# Patient Record
Sex: Male | Born: 1945 | Race: White | Hispanic: No | Marital: Married | Smoking: Never smoker
Health system: Southern US, Community
[De-identification: ages and names within clinical notes are randomized; demographics above are authoritative.]

## PROBLEM LIST (undated history)

## (undated) DIAGNOSIS — R42 Dizziness and giddiness: Secondary | ICD-10-CM

## (undated) DIAGNOSIS — M1990 Unspecified osteoarthritis, unspecified site: Secondary | ICD-10-CM

## (undated) DIAGNOSIS — I10 Essential (primary) hypertension: Secondary | ICD-10-CM

## (undated) HISTORY — PX: JOINT REPLACEMENT: SHX530

## (undated) HISTORY — PX: BACK SURGERY: SHX140

## (undated) HISTORY — PX: DIAGNOSTIC LAPAROSCOPY: SUR761

## (undated) HISTORY — PX: CHOLECYSTECTOMY: SHX55

---

## 2016-12-30 HISTORY — PX: TOTAL SHOULDER REPLACEMENT: SUR1217

## 2021-03-26 ENCOUNTER — Other Ambulatory Visit: Payer: Self-pay | Admitting: Neurological Surgery

## 2021-03-26 DIAGNOSIS — M5127 Other intervertebral disc displacement, lumbosacral region: Secondary | ICD-10-CM

## 2021-04-02 ENCOUNTER — Ambulatory Visit (INDEPENDENT_AMBULATORY_CARE_PROVIDER_SITE_OTHER): Payer: Medicare Other

## 2021-04-02 ENCOUNTER — Other Ambulatory Visit: Payer: Self-pay

## 2021-04-02 DIAGNOSIS — M5126 Other intervertebral disc displacement, lumbar region: Secondary | ICD-10-CM | POA: Diagnosis not present

## 2021-04-02 DIAGNOSIS — M5127 Other intervertebral disc displacement, lumbosacral region: Secondary | ICD-10-CM

## 2021-04-02 MED ORDER — GADOBUTROL 1 MMOL/ML IV SOLN
9.5000 mL | Freq: Once | INTRAVENOUS | Status: AC | PRN
  Administered 2021-04-02: 9.5 mL via INTRAVENOUS

## 2021-04-17 ENCOUNTER — Other Ambulatory Visit: Payer: Self-pay | Admitting: Neurological Surgery

## 2021-04-26 NOTE — Progress Notes (Signed)
Surgical Instructions    Your procedure is scheduled on Tuesday May 3,2022  Report to [facility] Main Entrance "A" at 1030 A.M., then check in with the Admitting office.  Call this number if you have problems the morning of surgery:  [phone]   If you have any questions prior to your surgery date call [phone]: Open Monday-Friday 8am-4pm    Remember:  Do not eat or drink after midnight the night before your surgery     Take these medicines the morning of surgery with A SIP OF WATER:              gabapentin (NEURONTIN)               If needed you may take:             acetaminophen (TYLENOL)             traMADol (ULTRAM)  As of today, STOP taking any Aspirin (unless otherwise instructed by your surgeon) Aleve, Naproxen, Ibuprofen, Motrin, Advil, Goody's, BC's, all herbal medications, fish oil, and all vitamins.                     Do not wear jewelry, make up, or nail polish            Do not wear lotions, powders, perfumes/colognes, or deodorant.            Do not shave 48 hours prior to surgery.  Men may shave face and neck.            Do not bring valuables to the hospital.            [facility] is not responsible for any belongings or valuables.  Do NOT Smoke (Tobacco/Vaping) or drink Alcohol 24 hours prior to your procedure If you use a CPAP at night, you may bring all equipment for your overnight stay.   Contacts, glasses, dentures or bridgework may not be worn into surgery, please bring cases for these belongings   For patients admitted to the hospital, discharge time will be determined by your treatment team.   Patients discharged the day of surgery will not be allowed to drive home, and someone needs to stay with them for 24 hours.    Special instructions:   [place]- Preparing For Surgery  Before surgery, you can play an important role. Because skin is not sterile, your skin needs to be as free of germs as possible. You can reduce the number of germs  on your skin by washing with CHG (chlorahexidine gluconate) Soap before surgery.  CHG is an antiseptic cleaner which kills germs and bonds with the skin to continue killing germs even after washing.    Oral Hygiene is also important to reduce your risk of infection.  Remember - BRUSH YOUR TEETH THE MORNING OF SURGERY WITH YOUR REGULAR TOOTHPASTE  Please do not use if you have an allergy to CHG or antibacterial soaps. If your skin becomes reddened/irritated stop using the CHG.  Do not shave (including legs and underarms) for at least 48 hours prior to first CHG shower. It is OK to shave your face.  Please follow these instructions carefully.   1. Shower the NIGHT BEFORE SURGERY and the MORNING OF SURGERY  2. If you chose to wash your hair, wash your hair first as usual with your normal shampoo.  3. After you shampoo, rinse your hair and body thoroughly to remove the shampoo.  4.  Wash Face and genitals (private parts) with your normal soap.   5.  Shower the NIGHT BEFORE SURGERY and the MORNING OF SURGERY with CHG Soap.   6. Use CHG Soap as you would any other liquid soap. You can apply CHG directly to the skin and wash gently with a scrungie or a clean washcloth.   7. Apply the CHG Soap to your body ONLY FROM THE NECK DOWN.  Do not use on open wounds or open sores. Avoid contact with your eyes, ears, mouth and genitals (private parts). Wash Face and genitals (private parts)  with your normal soap.   8. Wash thoroughly, paying special attention to the area where your surgery will be performed.  9. Thoroughly rinse your body with warm water from the neck down.  10. DO NOT shower/wash with your normal soap after using and rinsing off the CHG Soap.  11. Pat yourself dry with a CLEAN TOWEL.  12. Wear CLEAN PAJAMAS to bed the night before surgery  13. Place CLEAN SHEETS on your bed the night before your surgery  14. DO NOT SLEEP WITH PETS.   Day of Surgery: Take a shower with CHG  soap. Wear Clean/Comfortable clothing the morning of surgery Do not apply any deodorants/lotions.   Remember to brush your teeth WITH YOUR REGULAR TOOTHPASTE.   Please read over the following fact sheets that you were given.

## 2021-04-27 ENCOUNTER — Other Ambulatory Visit: Payer: Self-pay

## 2021-04-27 ENCOUNTER — Encounter (HOSPITAL_COMMUNITY)
Admission: RE | Admit: 2021-04-27 | Discharge: 2021-04-27 | Disposition: A | Discharge status: Home / Self Care | Payer: Medicare Other | Source: Ambulatory Visit | Attending: Neurological Surgery | Admitting: Neurological Surgery

## 2021-04-27 ENCOUNTER — Encounter (HOSPITAL_COMMUNITY): Payer: Self-pay

## 2021-04-27 DIAGNOSIS — Z01818 Encounter for other preprocedural examination: Secondary | ICD-10-CM | POA: Insufficient documentation

## 2021-04-27 DIAGNOSIS — I10 Essential (primary) hypertension: Secondary | ICD-10-CM | POA: Insufficient documentation

## 2021-04-27 DIAGNOSIS — Z20822 Contact with and (suspected) exposure to covid-19: Secondary | ICD-10-CM | POA: Diagnosis not present

## 2021-04-27 DIAGNOSIS — Z79899 Other long term (current) drug therapy: Secondary | ICD-10-CM | POA: Diagnosis not present

## 2021-04-27 HISTORY — DX: Unspecified osteoarthritis, unspecified site: M19.90

## 2021-04-27 HISTORY — DX: Essential (primary) hypertension: I10

## 2021-04-27 LAB — SURGICAL PCR SCREEN
MRSA, PCR: NEGATIVE
Staphylococcus aureus: NEGATIVE

## 2021-04-27 LAB — CBC
HCT: 45.3 % (ref 39.0–52.0)
Hemoglobin: 15.6 g/dL (ref 13.0–17.0)
MCH: 31.1 pg (ref 26.0–34.0)
MCHC: 34.4 g/dL (ref 30.0–36.0)
MCV: 90.4 fL (ref 80.0–100.0)
Platelets: 185 10*3/uL (ref 150–400)
RBC: 5.01 MIL/uL (ref 4.22–5.81)
RDW: 12.7 % (ref 11.5–15.5)
WBC: 7.2 10*3/uL (ref 4.0–10.5)
nRBC: 0 % (ref 0.0–0.2)

## 2021-04-27 LAB — BASIC METABOLIC PANEL
Anion gap: 6 (ref 5–15)
BUN: 10 mg/dL (ref 8–23)
CO2: 27 mmol/L (ref 22–32)
Calcium: 9.3 mg/dL (ref 8.9–10.3)
Chloride: 96 mmol/L — ABNORMAL LOW (ref 98–111)
Creatinine, Ser: 0.9 mg/dL (ref 0.61–1.24)
GFR, Estimated: >60 mL/min (ref >60)
Glucose, Bld: 91 mg/dL (ref 70–99)
Potassium: 4.4 mmol/L (ref 3.5–5.1)
Sodium: 129 mmol/L — ABNORMAL LOW (ref 135–145)

## 2021-04-27 LAB — TYPE AND SCREEN
ABO/RH(D): A POS
Antibody Screen: NEGATIVE

## 2021-04-27 NOTE — Progress Notes (Signed)
PCP - Champ Mungo PA, [facility] Cardiologist - Dr. Paulette Blanch Nourrendine,[facility]([place])  PPM/ICD - denies  Chest x-ray - none EKG - 04/27/21 Stress Test - none ECHO - 09/21/20 Cardiac Cath - none  Sleep Study - denies CPAP -na   Fasting Blood Sugar - na Checks Blood Sugar _na  times a day  Blood Thinner Instructions:na Aspirin Instructions:per pre op instruction    COVID TEST-04/27/21    Anesthesia review: no  Patient denies shortness of breath, fever, cough and chest pain at PAT appointment   All instructions explained to the patient, with a verbal understanding of the material. Patient agrees to go over the instructions while at home for a better understanding. Patient also instructed to self quarantine after being tested for COVID-19. The opportunity to ask questions was provided.

## 2021-04-27 NOTE — Progress Notes (Addendum)
Pt's BP was 156/100 on presentation to PAT visit. Pt denied chest pain,SOB. States he took his Lisinopril this morning. Lisinopril dose has been increased from 5mg  to 10 mg per pt and per his PCP's note of 04/24/21. BP rechecked at end of PAT visit was 168/93. Notified anesthesia APP,Angela Kabbe, of pt's elevated BP. Encouraged pt to continue to monitor his BP at home and notify his PCP if it remains elevated. Explained the importance of blood pressure control for preventing heart attack and stroke. Pt stated that he was aware of the risks and he will continue to monitor.

## 2021-04-28 LAB — SARS CORONAVIRUS 2 (TAT 6-24 HRS): SARS Coronavirus 2: NEGATIVE

## 2021-04-30 ENCOUNTER — Encounter (HOSPITAL_COMMUNITY): Payer: Self-pay | Admitting: Neurological Surgery

## 2021-04-30 NOTE — Progress Notes (Signed)
Anesthesia Chart Review:  Hypertension noted to be poorly controlled preadmission testing appointment.  156/100 on arrival and 168/93 on recheck.  Patient reported he is compliant with his lisinopril.  He was instructed to continue to monitor at home and if it remains elevated he will call his PCP.  He understands that markedly uncontrolled hypertension on day of surgery could be cause for cancellation.  He was recently seen by his PCP Champ Mungo, PA-C 04/24/2021 and at that time discussed suboptimal blood pressure control (150/98 at that appointment).  His lisinopril was increased from 5 mg to 10 mg.  It was discussed that he was having lumbar fusion the following week.  Also reviewed results of recent echocardiogram September 2021 with pt which showed normal LVEF, mild aortic root dilation.  Patient was seen by cardiology 09/08/2020 for evaluation of PVCs and abnormal ekg.  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  Holter monitor showed frequent ventricular ectopy with PVC burden of 9.4%.  Predominant rhythm was sinus bradycardia with frequent ventricular ectopy, frequent supraventricular ectopy, and occurrences of first-degree AVB.  Mild hyponatremia noted on preop labs, sodium 129. Review of labs in care everywhere also shows sodium 129 on 08/03/20, improved to 138 on recheck 08/10/20. Will be rechecked on DOS.   EKG 08/03/20 (copy on chart): Undetermined rhythm - frequent ectopic ventricular beats. Rate 65. Nonspecific T-abnormality, nonspecific ST depression.  Event monitor 10/05/2020 (Care Everywhere): *The predominant rhythm was sinus bradycardia to sinus rhythm with frequent ventricular ectopy , frequent  supraventricular ectopy, and occurrences of 1st degree AVB.  *The Maximum Heart Rate recorded was 132 bpm, Day 3 / 03:42:02 pm, the Minimum Heart Rate recorded was 36  bpm, Day 3 / 06:23:55 am and the Average Heart Rate was 61 bpm.  *There were 57900  PVCs with a burden of 9.4 %. There was 1 occurrences of Ventricular Tachycardia with the  longest episode 3 beats, Day 5 / 06:05:32 pm and the fastestepisode 117 bpm, Day 5 / 06:05:32 pm.  *There were 35979 PSVCs with a burden of 5.84 %. There were 34 occurrences of Supraventricular Tachycardia  with the longest episode 11 beats, Day 8 / 06:38:50 am and the fastest episode 132 bpm, Day 6 / 11:15:39 am.  *Diary events were not entered. There were 0 Patient triggered events.  TTE 09/21/2020 (Care Everywhere): Left Ventricle  Normal left ventricular size. Wall thickness is normal. EF: 55-60%. Wall motion is normal. Doppler parameters are indeterminate for diastolic function.   Right Ventricle  Right ventricle appears normal. Systolic function is normal.   Left Atrium  Left atrium is moderately dilated.   Right Atrium  Normal sized right atrium.   IVC/SVC  IVC not well visualized.   Mitral Valve  The leaflets are mildly thickened. There is mild regurgitation. There is no evidence of mitral valve stenosis.   Tricuspid Valve  The leaflets exhibit normal excursion. There is mild regurgitation. The right ventricular systolic pressure is normal (<36 mmHg).   Aortic Valve  The leaflets are mildly thickened and exhibit normal excursion. Mild regurgitation.There is no evidence of aortic valve stenosis.   Pulmonic Valve  Pulmonic valve with normal excursion. Trace regurgitation.   Ascending Aorta  The aortic root is mildly dilated, measures 3.6 cm.   Pericardium  There is no pericardial effusion.   Study Details  A complete echo was performed using complete 2D, color flow Doppler and spectral Doppler.  [facility]annie Cove [facility] Short Stay Center/Anesthesiology Phone [phone] 04/30/2021 11:10 AM

## 2021-04-30 NOTE — Anesthesia Preprocedure Evaluation (Addendum)
Anesthesia Evaluation  Patient identified by MRN, date of birth, ID band Patient awake    Reviewed: Allergy & Precautions, H&P , NPO status , Patient's Chart, lab work & pertinent test results  Airway Mallampati: II  TM Distance: >3 FB Neck ROM: Full    Dental no notable dental hx. (+) Teeth Intact, Dental Advisory Given   Pulmonary neg pulmonary ROS,    Pulmonary exam normal breath sounds clear to auscultation       Cardiovascular hypertension, Pt. on medications negative cardio ROS   Rhythm:Regular Rate:Normal     Neuro/Psych negative neurological ROS  negative psych ROS   GI/Hepatic negative GI ROS, Neg liver ROS,   Endo/Other  negative endocrine ROS  Renal/GU negative Renal ROS  negative genitourinary   Musculoskeletal  (+) Arthritis , Osteoarthritis,    Abdominal   Peds  Hematology negative hematology ROS (+)   Anesthesia Other Findings   Reproductive/Obstetrics negative OB ROS                           Anesthesia Physical Anesthesia Plan  ASA: II  Anesthesia Plan: General   Post-op Pain Management:    Induction: Intravenous  PONV Risk Score and Plan: 3 and Ondansetron, Dexamethasone and Midazolam  Airway Management Planned: Oral ETT  Additional Equipment:   Intra-op Plan:   Post-operative Plan: Extubation in OR  Informed Consent: I have reviewed the patients History and Physical, chart, labs and discussed the procedure including the risks, benefits and alternatives for the proposed anesthesia with the patient or authorized representative who has indicated his/her understanding and acceptance.     Dental advisory given  Plan Discussed with: CRNA  Anesthesia Plan Comments: (PAT note by Antionette Poles, PA-C: Hypertension noted to be poorly controlled preadmission testing appointment.  156/100 on arrival and 168/93 on recheck.  Patient reported he is compliant with his  lisinopril.  He was instructed to continue to monitor at home and if it remains elevated he will call his PCP.  He understands that markedly uncontrolled hypertension on day of surgery could be cause for cancellation.  He was recently seen by his PCP Champ Mungo, PA-C 04/24/2021 and at that time discussed suboptimal blood pressure control (150/98 at that appointment).  His lisinopril was increased from 5 mg to 10 mg.  It was discussed that he was having lumbar fusion the following week.  Also reviewed results of recent echocardiogram September 2021 with pt which showed normal LVEF, mild aortic root dilation.  Patient was seen by cardiology 09/08/2020 for evaluation of PVCs and abnormal ekg.  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  Holter monitor showed frequent ventricular ectopy with PVC burden of 9.4%.  Predominant rhythm was sinus bradycardia with frequent ventricular ectopy, frequent supraventricular ectopy, and occurrences of first-degree AVB.  Mild hyponatremia noted on preop labs, sodium 129. Review of labs in care everywhere also shows sodium 129 on 08/03/20, improved to 138 on recheck 08/10/20. Will be rechecked on DOS.   EKG 08/03/20 (copy on chart): Undetermined rhythm - frequent ectopic ventricular beats. Rate 65. Nonspecific T-abnormality, nonspecific ST depression.  Event monitor 10/05/2020 (Care Everywhere): *The predominant rhythm was sinus bradycardia to sinus rhythm with frequent ventricular ectopy , frequent  supraventricular ectopy, and occurrences of 1st degree AVB.  *The Maximum Heart Rate recorded was 132 bpm, Day 3 / 03:42:02 pm, the Minimum Heart Rate recorded was 36  bpm, Day 3 / 06:23:55 am and the Average Heart Rate was 61 bpm.  *There were 57900 PVCs with a burden of 9.4 %. There was 1 occurrences of Ventricular Tachycardia with the  longest episode 3 beats, Day 5 / 06:05:32 pm and the fastestepisode 117 bpm, Day 5 / 06:05:32 pm.   *There were 35979 PSVCs with a burden of 5.84 %. There were 34 occurrences of Supraventricular Tachycardia  with the longest episode 11 beats, Day 8 / 06:38:50 am and the fastest episode 132 bpm, Day 6 / 11:15:39 am.  *Diary events were not entered. There were 0 Patient triggered events.  TTE 09/21/2020 (Care Everywhere): Left Ventricle  Normal left ventricular size. Wall thickness is normal. EF: 55-60%. Wall motion is normal. Doppler parameters are indeterminate for diastolic function.   Right Ventricle  Right ventricle appears normal. Systolic function is normal.   Left Atrium  Left atrium is moderately dilated.   Right Atrium  Normal sized right atrium.   IVC/SVC  IVC not well visualized.   Mitral Valve  The leaflets are mildly thickened. There is mild regurgitation. There is no evidence of mitral valve stenosis.   Tricuspid Valve  The leaflets exhibit normal excursion. There is mild regurgitation. The right ventricular systolic pressure is normal (<36 mmHg).   Aortic Valve  The leaflets are mildly thickened and exhibit normal excursion. Mild regurgitation.There is no evidence of aortic valve stenosis.   Pulmonic Valve  Pulmonic valve with normal excursion. Trace regurgitation.   Ascending Aorta  The aortic root is mildly dilated, measures 3.6 cm.   Pericardium  There is no pericardial effusion.   Study Details  A complete echo was performed using complete 2D, color flow Doppler and spectral Doppler.   )       Anesthesia Quick Evaluation

## 2021-05-01 ENCOUNTER — Encounter (HOSPITAL_COMMUNITY)
Admission: RE | Disposition: A | Discharge status: Home / Self Care | Payer: Self-pay | Source: Home / Self Care | Attending: Neurological Surgery

## 2021-05-01 ENCOUNTER — Observation Stay (HOSPITAL_COMMUNITY)
Admission: RE | Admit: 2021-05-01 | Discharge: 2021-05-02 | Disposition: A | Discharge status: Home / Self Care | Payer: Medicare Other | Attending: Neurological Surgery | Admitting: Neurological Surgery

## 2021-05-01 ENCOUNTER — Inpatient Hospital Stay (HOSPITAL_COMMUNITY): Payer: Medicare Other | Admitting: Emergency Medicine

## 2021-05-01 ENCOUNTER — Inpatient Hospital Stay (HOSPITAL_COMMUNITY): Payer: Medicare Other

## 2021-05-01 ENCOUNTER — Other Ambulatory Visit: Payer: Self-pay

## 2021-05-01 ENCOUNTER — Encounter (HOSPITAL_COMMUNITY): Payer: Self-pay | Admitting: Neurological Surgery

## 2021-05-01 ENCOUNTER — Inpatient Hospital Stay (HOSPITAL_COMMUNITY): Payer: Medicare Other | Admitting: Physician Assistant

## 2021-05-01 DIAGNOSIS — M5116 Intervertebral disc disorders with radiculopathy, lumbar region: Secondary | ICD-10-CM | POA: Diagnosis not present

## 2021-05-01 DIAGNOSIS — I10 Essential (primary) hypertension: Secondary | ICD-10-CM | POA: Diagnosis not present

## 2021-05-01 DIAGNOSIS — Z79899 Other long term (current) drug therapy: Secondary | ICD-10-CM | POA: Insufficient documentation

## 2021-05-01 DIAGNOSIS — Z419 Encounter for procedure for purposes other than remedying health state, unspecified: Secondary | ICD-10-CM

## 2021-05-01 DIAGNOSIS — M5126 Other intervertebral disc displacement, lumbar region: Secondary | ICD-10-CM | POA: Diagnosis present

## 2021-05-01 DIAGNOSIS — Z96612 Presence of left artificial shoulder joint: Secondary | ICD-10-CM | POA: Diagnosis not present

## 2021-05-01 HISTORY — DX: Dizziness and giddiness: R42

## 2021-05-01 LAB — ABO/RH: ABO/RH(D): A POS

## 2021-05-01 LAB — POCT I-STAT, CHEM 8
BUN: 14 mg/dL (ref 8–23)
Calcium, Ion: 1.18 mmol/L (ref 1.15–1.40)
Chloride: 96 mmol/L — ABNORMAL LOW (ref 98–111)
Creatinine, Ser: 0.9 mg/dL (ref 0.61–1.24)
Glucose, Bld: 87 mg/dL (ref 70–99)
HCT: 47 % (ref 39.0–52.0)
Hemoglobin: 16 g/dL (ref 13.0–17.0)
Potassium: 4.4 mmol/L (ref 3.5–5.1)
Sodium: 130 mmol/L — ABNORMAL LOW (ref 135–145)
TCO2: 26 mmol/L (ref 22–32)

## 2021-05-01 SURGERY — POSTERIOR LUMBAR FUSION 1 LEVEL
Anesthesia: General | Site: Spine Lumbar

## 2021-05-01 MED ORDER — LIDOCAINE-EPINEPHRINE 1 %-1:100000 IJ SOLN
INTRAMUSCULAR | Status: DC | PRN
  Administered 2021-05-01: 5 mL

## 2021-05-01 MED ORDER — ALUM & MAG HYDROXIDE-SIMETH 200-200-20 MG/5ML PO SUSP: 30.0000 mL | Freq: Four times a day (QID) | ORAL | Status: DC | PRN

## 2021-05-01 MED ORDER — MORPHINE SULFATE (PF) 2 MG/ML IV SOLN: 2.0000 mg | INTRAVENOUS | Status: DC | PRN

## 2021-05-01 MED ORDER — KETOROLAC TROMETHAMINE 15 MG/ML IJ SOLN
INTRAMUSCULAR | Status: AC
  Filled 2021-05-01: qty 1

## 2021-05-01 MED ORDER — CHLORHEXIDINE GLUCONATE CLOTH 2 % EX PADS: 6.0000 | MEDICATED_PAD | Freq: Once | CUTANEOUS | Status: DC

## 2021-05-01 MED ORDER — SODIUM CHLORIDE 0.9 % IV SOLN: 250.0000 mL | INTRAVENOUS | Status: DC

## 2021-05-01 MED ORDER — CEFAZOLIN SODIUM-DEXTROSE 2-4 GM/100ML-% IV SOLN
INTRAVENOUS | Status: AC
  Filled 2021-05-01: qty 100

## 2021-05-01 MED ORDER — ACETAMINOPHEN 325 MG PO TABS: 650.0000 mg | ORAL_TABLET | ORAL | Status: DC | PRN

## 2021-05-01 MED ORDER — PHENOL 1.4 % MT LIQD: 1.0000 | OROMUCOSAL | Status: DC | PRN

## 2021-05-01 MED ORDER — ONDANSETRON HCL 4 MG/2ML IJ SOLN
INTRAMUSCULAR | Status: AC
  Filled 2021-05-01: qty 2

## 2021-05-01 MED ORDER — CHLORHEXIDINE GLUCONATE 0.12 % MT SOLN: 15.0000 mL | Freq: Once | OROMUCOSAL | Status: AC

## 2021-05-01 MED ORDER — FLEET ENEMA 7-19 GM/118ML RE ENEM: 1.0000 | ENEMA | Freq: Once | RECTAL | Status: DC | PRN

## 2021-05-01 MED ORDER — GLYCOPYRROLATE PF 0.2 MG/ML IJ SOSY
PREFILLED_SYRINGE | INTRAMUSCULAR | Status: DC | PRN
  Administered 2021-05-01: .2 mg via INTRAVENOUS

## 2021-05-01 MED ORDER — LISINOPRIL 10 MG PO TABS
5.0000 mg | ORAL_TABLET | Freq: Every day | ORAL | Status: DC
  Administered 2021-05-01 – 2021-05-02 (×2): 5 mg via ORAL
  Filled 2021-05-01 (×2): qty 1

## 2021-05-01 MED ORDER — SODIUM CHLORIDE 0.9% FLUSH: 3.0000 mL | INTRAVENOUS | Status: DC | PRN

## 2021-05-01 MED ORDER — HYDROMORPHONE HCL 1 MG/ML IJ SOLN
INTRAMUSCULAR | Status: AC
  Filled 2021-05-01: qty 1

## 2021-05-01 MED ORDER — CEFAZOLIN SODIUM-DEXTROSE 2-4 GM/100ML-% IV SOLN
2.0000 g | Freq: Three times a day (TID) | INTRAVENOUS | Status: AC
  Administered 2021-05-01 – 2021-05-02 (×2): 2 g via INTRAVENOUS
  Filled 2021-05-01 (×2): qty 100

## 2021-05-01 MED ORDER — METHOCARBAMOL 1000 MG/10ML IJ SOLN
500.0000 mg | Freq: Four times a day (QID) | INTRAVENOUS | Status: DC | PRN
  Filled 2021-05-01: qty 5

## 2021-05-01 MED ORDER — ACETAMINOPHEN 10 MG/ML IV SOLN
INTRAVENOUS | Status: DC | PRN
  Administered 2021-05-01: 100 mg via INTRAVENOUS

## 2021-05-01 MED ORDER — GLYCOPYRROLATE PF 0.2 MG/ML IJ SOSY
PREFILLED_SYRINGE | INTRAMUSCULAR | Status: AC
  Filled 2021-05-01: qty 1

## 2021-05-01 MED ORDER — ONDANSETRON HCL 4 MG/2ML IJ SOLN
INTRAMUSCULAR | Status: DC | PRN
  Administered 2021-05-01: 4 mg via INTRAVENOUS

## 2021-05-01 MED ORDER — POLYETHYLENE GLYCOL 3350 17 G PO PACK: 17.0000 g | PACK | Freq: Every day | ORAL | Status: DC | PRN

## 2021-05-01 MED ORDER — DOCUSATE SODIUM 100 MG PO CAPS
100.0000 mg | ORAL_CAPSULE | Freq: Two times a day (BID) | ORAL | Status: DC
  Administered 2021-05-01 – 2021-05-02 (×2): 100 mg via ORAL
  Filled 2021-05-01 (×2): qty 1

## 2021-05-01 MED ORDER — TRAMADOL HCL 50 MG PO TABS
50.0000 mg | ORAL_TABLET | Freq: Four times a day (QID) | ORAL | Status: DC | PRN
Start: 2021-05-01 — End: 2021-05-02

## 2021-05-01 MED ORDER — HYDROMORPHONE HCL 1 MG/ML IJ SOLN
0.5000 mg | INTRAMUSCULAR | Status: DC | PRN
  Administered 2021-05-01: 0.5 mg via INTRAVENOUS

## 2021-05-01 MED ORDER — SUGAMMADEX SODIUM 200 MG/2ML IV SOLN
INTRAVENOUS | Status: DC | PRN
  Administered 2021-05-01: 200 mg via INTRAVENOUS

## 2021-05-01 MED ORDER — ROCURONIUM BROMIDE 10 MG/ML (PF) SYRINGE
PREFILLED_SYRINGE | INTRAVENOUS | Status: AC
  Filled 2021-05-01: qty 20

## 2021-05-01 MED ORDER — THROMBIN 5000 UNITS EX SOLR
CUTANEOUS | Status: AC
  Filled 2021-05-01: qty 5000

## 2021-05-01 MED ORDER — BUPIVACAINE HCL (PF) 0.5 % IJ SOLN
INTRAMUSCULAR | Status: DC | PRN
  Administered 2021-05-01: 5 mL
  Administered 2021-05-01: 25 mL

## 2021-05-01 MED ORDER — SENNA 8.6 MG PO TABS
1.0000 | ORAL_TABLET | Freq: Two times a day (BID) | ORAL | Status: DC
  Administered 2021-05-01 – 2021-05-02 (×2): 8.6 mg via ORAL
  Filled 2021-05-01 (×2): qty 1

## 2021-05-01 MED ORDER — ACETAMINOPHEN 650 MG RE SUPP: 650.0000 mg | RECTAL | Status: DC | PRN

## 2021-05-01 MED ORDER — SODIUM CHLORIDE 0.9% FLUSH: 3.0000 mL | Freq: Two times a day (BID) | INTRAVENOUS | Status: DC

## 2021-05-01 MED ORDER — LACTATED RINGERS IV SOLN: INTRAVENOUS | Status: DC

## 2021-05-01 MED ORDER — THROMBIN 5000 UNITS EX SOLR: OROMUCOSAL | Status: DC | PRN

## 2021-05-01 MED ORDER — MECLIZINE HCL 25 MG PO TABS
25.0000 mg | ORAL_TABLET | Freq: Three times a day (TID) | ORAL | Status: DC | PRN
  Filled 2021-05-01: qty 1

## 2021-05-01 MED ORDER — 0.9 % SODIUM CHLORIDE (POUR BTL) OPTIME
TOPICAL | Status: DC | PRN
  Administered 2021-05-01: 1000 mL

## 2021-05-01 MED ORDER — CHLORHEXIDINE GLUCONATE 0.12 % MT SOLN
OROMUCOSAL | Status: AC
  Administered 2021-05-01: 15 mL
  Filled 2021-05-01: qty 15

## 2021-05-01 MED ORDER — MIDAZOLAM HCL 5 MG/5ML IJ SOLN
INTRAMUSCULAR | Status: DC | PRN
  Administered 2021-05-01: 1 mg via INTRAVENOUS

## 2021-05-01 MED ORDER — OXYCODONE-ACETAMINOPHEN 5-325 MG PO TABS
1.0000 | ORAL_TABLET | ORAL | Status: DC | PRN
  Administered 2021-05-01 – 2021-05-02 (×4): 1 via ORAL
  Administered 2021-05-02: 2 via ORAL
  Administered 2021-05-02: 1 via ORAL
  Filled 2021-05-01 (×5): qty 1
  Filled 2021-05-01: qty 2

## 2021-05-01 MED ORDER — LIDOCAINE 2% (20 MG/ML) 5 ML SYRINGE
INTRAMUSCULAR | Status: DC | PRN
  Administered 2021-05-01: 60 mg via INTRAVENOUS

## 2021-05-01 MED ORDER — ACETAMINOPHEN 10 MG/ML IV SOLN
INTRAVENOUS | Status: AC
  Filled 2021-05-01: qty 100

## 2021-05-01 MED ORDER — FENTANYL CITRATE (PF) 100 MCG/2ML IJ SOLN
INTRAMUSCULAR | Status: DC | PRN
  Administered 2021-05-01 (×2): 50 ug via INTRAVENOUS
  Administered 2021-05-01: 100 ug via INTRAVENOUS
  Administered 2021-05-01: 50 ug via INTRAVENOUS

## 2021-05-01 MED ORDER — MIDAZOLAM HCL 2 MG/2ML IJ SOLN
INTRAMUSCULAR | Status: AC
  Filled 2021-05-01: qty 2

## 2021-05-01 MED ORDER — DEXAMETHASONE SODIUM PHOSPHATE 10 MG/ML IJ SOLN
INTRAMUSCULAR | Status: DC | PRN
  Administered 2021-05-01: 10 mg via INTRAVENOUS

## 2021-05-01 MED ORDER — PSYLLIUM 0.52 G PO CAPS: 1.0400 g | ORAL_CAPSULE | Freq: Every day | ORAL | Status: DC

## 2021-05-01 MED ORDER — PROPOFOL 10 MG/ML IV BOLUS
INTRAVENOUS | Status: DC | PRN
  Administered 2021-05-01: 120 mg via INTRAVENOUS

## 2021-05-01 MED ORDER — GABAPENTIN 300 MG PO CAPS
300.0000 mg | ORAL_CAPSULE | Freq: Three times a day (TID) | ORAL | Status: DC
  Administered 2021-05-01 – 2021-05-02 (×2): 300 mg via ORAL
  Filled 2021-05-01 (×2): qty 1

## 2021-05-01 MED ORDER — HYDROMORPHONE HCL 1 MG/ML IJ SOLN
0.2500 mg | INTRAMUSCULAR | Status: DC | PRN
  Administered 2021-05-01 (×4): 0.5 mg via INTRAVENOUS

## 2021-05-01 MED ORDER — METHOCARBAMOL 500 MG PO TABS
ORAL_TABLET | ORAL | Status: AC
  Filled 2021-05-01: qty 1

## 2021-05-01 MED ORDER — FENTANYL CITRATE (PF) 250 MCG/5ML IJ SOLN
INTRAMUSCULAR | Status: AC
  Filled 2021-05-01: qty 5

## 2021-05-01 MED ORDER — ONDANSETRON HCL 4 MG/2ML IJ SOLN: 4.0000 mg | Freq: Four times a day (QID) | INTRAMUSCULAR | Status: DC | PRN

## 2021-05-01 MED ORDER — CEFAZOLIN SODIUM-DEXTROSE 2-4 GM/100ML-% IV SOLN
2.0000 g | INTRAVENOUS | Status: AC
  Administered 2021-05-01: 2 g via INTRAVENOUS

## 2021-05-01 MED ORDER — THROMBIN 20000 UNITS EX SOLR: CUTANEOUS | Status: DC | PRN

## 2021-05-01 MED ORDER — THROMBIN 20000 UNITS EX SOLR
CUTANEOUS | Status: AC
  Filled 2021-05-01: qty 20000

## 2021-05-01 MED ORDER — PHENYLEPHRINE HCL-NACL 10-0.9 MG/250ML-% IV SOLN
INTRAVENOUS | Status: DC | PRN
  Administered 2021-05-01: 50 ug/min via INTRAVENOUS

## 2021-05-01 MED ORDER — BUPIVACAINE HCL (PF) 0.5 % IJ SOLN
INTRAMUSCULAR | Status: AC
  Filled 2021-05-01: qty 30

## 2021-05-01 MED ORDER — METHOCARBAMOL 500 MG PO TABS
500.0000 mg | ORAL_TABLET | Freq: Four times a day (QID) | ORAL | Status: DC | PRN
  Administered 2021-05-01 – 2021-05-02 (×4): 500 mg via ORAL
  Filled 2021-05-01 (×3): qty 1

## 2021-05-01 MED ORDER — ORAL CARE MOUTH RINSE: 15.0000 mL | Freq: Once | OROMUCOSAL | Status: AC

## 2021-05-01 MED ORDER — MENTHOL 3 MG MT LOZG: 1.0000 | LOZENGE | OROMUCOSAL | Status: DC | PRN

## 2021-05-01 MED ORDER — KETOROLAC TROMETHAMINE 15 MG/ML IJ SOLN
7.5000 mg | Freq: Four times a day (QID) | INTRAMUSCULAR | Status: DC
  Administered 2021-05-01 – 2021-05-02 (×3): 7.5 mg via INTRAVENOUS
  Filled 2021-05-01 (×2): qty 1

## 2021-05-01 MED ORDER — ROCURONIUM BROMIDE 10 MG/ML (PF) SYRINGE
PREFILLED_SYRINGE | INTRAVENOUS | Status: DC | PRN
  Administered 2021-05-01 (×2): 10 mg via INTRAVENOUS
  Administered 2021-05-01: 70 mg via INTRAVENOUS
  Administered 2021-05-01: 10 mg via INTRAVENOUS
  Administered 2021-05-01: 20 mg via INTRAVENOUS

## 2021-05-01 MED ORDER — ONDANSETRON HCL 4 MG PO TABS: 4.0000 mg | ORAL_TABLET | Freq: Four times a day (QID) | ORAL | Status: DC | PRN

## 2021-05-01 MED ORDER — PHENYLEPHRINE HCL (PRESSORS) 10 MG/ML IV SOLN
INTRAVENOUS | Status: DC | PRN
  Administered 2021-05-01: 120 ug via INTRAVENOUS

## 2021-05-01 MED ORDER — BISACODYL 10 MG RE SUPP: 10.0000 mg | Freq: Every day | RECTAL | Status: DC | PRN

## 2021-05-01 SURGICAL SUPPLY — 67 items
BASKET BONE COLLECTION (BASKET) ×2 IMPLANT
BLADE BN FN 3.2XSTRL LF (MISCELLANEOUS) ×1 IMPLANT
BLADE BONE MILL FINE (MISCELLANEOUS) ×1
BLADE CLIPPER SURG (BLADE) IMPLANT
BONE CANC CHIPS 20CC PCAN1/4 (Bone Implant) ×2 IMPLANT
BUR MATCHSTICK NEURO 3.0 LAGG (BURR) ×2 IMPLANT
CAGE COROENT LG 10X9X23-12 (Cage) ×4 IMPLANT
CANISTER SUCT 3000ML PPV (MISCELLANEOUS) ×2 IMPLANT
CHIPS CANC BONE 20CC PCAN1/4 (Bone Implant) ×1 IMPLANT
CNTNR URN SCR LID CUP LEK RST (MISCELLANEOUS) ×1 IMPLANT
CONT SPEC 4OZ STRL OR WHT (MISCELLANEOUS) ×1
COVER BACK TABLE 60X90IN (DRAPES) ×2 IMPLANT
COVER WAND RF STERILE (DRAPES) ×2 IMPLANT
DECANTER SPIKE VIAL GLASS SM (MISCELLANEOUS) IMPLANT
DERMABOND ADVANCED (GAUZE/BANDAGES/DRESSINGS) ×1
DERMABOND ADVANCED .7 DNX12 (GAUZE/BANDAGES/DRESSINGS) ×1 IMPLANT
DEVICE DISSECT PLASMABLAD 3.0S (MISCELLANEOUS) ×1 IMPLANT
DRAPE C-ARM 42X72 X-RAY (DRAPES) ×4 IMPLANT
DRAPE HALF SHEET 40X57 (DRAPES) IMPLANT
DRAPE LAPAROTOMY 100X72X124 (DRAPES) ×2 IMPLANT
DRSG OPSITE POSTOP 4X6 (GAUZE/BANDAGES/DRESSINGS) ×2 IMPLANT
DURAPREP 26ML APPLICATOR (WOUND CARE) ×2 IMPLANT
DURASEAL APPLICATOR TIP (TIP) IMPLANT
DURASEAL SPINE SEALANT 3ML (MISCELLANEOUS) IMPLANT
ELECT REM PT RETURN 9FT ADLT (ELECTROSURGICAL) ×2
ELECTRODE REM PT RTRN 9FT ADLT (ELECTROSURGICAL) ×1 IMPLANT
GAUZE 4X4 16PLY RFD (DISPOSABLE) ×2 IMPLANT
GAUZE SPONGE 4X4 12PLY STRL (GAUZE/BANDAGES/DRESSINGS) ×2 IMPLANT
GLOVE BIOGEL PI IND STRL 8.5 (GLOVE) ×2 IMPLANT
GLOVE BIOGEL PI INDICATOR 8.5 (GLOVE) ×2
GLOVE ECLIPSE 8.5 STRL (GLOVE) ×4 IMPLANT
GLOVE SRG 8 PF TXTR STRL LF DI (GLOVE) ×4 IMPLANT
GLOVE SURG LTX SZ7.5 (GLOVE) ×2 IMPLANT
GLOVE SURG POLYISO LF SZ7 (GLOVE) ×2 IMPLANT
GLOVE SURG UNDER POLY LF SZ7.5 (GLOVE) ×4 IMPLANT
GLOVE SURG UNDER POLY LF SZ8 (GLOVE) ×4
GOWN STRL REUS W/ TWL LRG LVL3 (GOWN DISPOSABLE) IMPLANT
GOWN STRL REUS W/ TWL XL LVL3 (GOWN DISPOSABLE) IMPLANT
GOWN STRL REUS W/TWL 2XL LVL3 (GOWN DISPOSABLE) ×8 IMPLANT
GOWN STRL REUS W/TWL LRG LVL3 (GOWN DISPOSABLE)
GOWN STRL REUS W/TWL XL LVL3 (GOWN DISPOSABLE)
HEMOSTAT POWDER KIT SURGIFOAM (HEMOSTASIS) IMPLANT
KIT BASIN OR (CUSTOM PROCEDURE TRAY) ×2 IMPLANT
KIT TURNOVER KIT B (KITS) ×2 IMPLANT
MILL MEDIUM DISP (BLADE) IMPLANT
NEEDLE HYPO 22GX1.5 SAFETY (NEEDLE) ×2 IMPLANT
NS IRRIG 1000ML POUR BTL (IV SOLUTION) ×2 IMPLANT
PACK LAMINECTOMY NEURO (CUSTOM PROCEDURE TRAY) ×2 IMPLANT
PAD ARMBOARD 7.5X6 YLW CONV (MISCELLANEOUS) ×6 IMPLANT
PATTIES SURGICAL .5 X1 (DISPOSABLE) ×2 IMPLANT
PLASMABLADE 3.0S (MISCELLANEOUS) ×2
ROD RELINE LORDOTIC 5.5X45 (Rod) ×4 IMPLANT
SCREW LOCK RELINE 5.5 TULIP (Screw) ×8 IMPLANT
SCREW RELINE-O POLY 6.5X45 (Screw) ×8 IMPLANT
SPONGE LAP 4X18 RFD (DISPOSABLE) IMPLANT
SPONGE SURGIFOAM ABS GEL 100 (HEMOSTASIS) ×2 IMPLANT
SUT PROLENE 6 0 BV (SUTURE) IMPLANT
SUT VIC AB 1 CT1 18XBRD ANBCTR (SUTURE) ×1 IMPLANT
SUT VIC AB 1 CT1 8-18 (SUTURE) ×1
SUT VIC AB 2-0 CP2 18 (SUTURE) ×2 IMPLANT
SUT VIC AB 3-0 SH 8-18 (SUTURE) ×2 IMPLANT
SUT VIC AB 4-0 RB1 18 (SUTURE) ×2 IMPLANT
SYR 3ML LL SCALE MARK (SYRINGE) ×8 IMPLANT
TOWEL GREEN STERILE (TOWEL DISPOSABLE) ×2 IMPLANT
TOWEL GREEN STERILE FF (TOWEL DISPOSABLE) ×2 IMPLANT
TRAY FOLEY MTR SLVR 16FR STAT (SET/KITS/TRAYS/PACK) ×2 IMPLANT
WATER STERILE IRR 1000ML POUR (IV SOLUTION) ×2 IMPLANT

## 2021-05-01 NOTE — Progress Notes (Signed)
Orthopedic Tech Progress Note Patient Details:  ROARK RUFO [birth year] [id number] Dropped off LSO to 3C03 Ortho Devices Type of Ortho Device: Lumbar corsett Ortho Device/Splint Location: BACK Ortho Device/Splint Interventions: Ordered   Post Interventions Patient Tolerated: Well Instructions Provided: Care of device   Donald Pore 05/01/2021, 6:04 PM

## 2021-05-01 NOTE — Transfer of Care (Signed)
Immediate Anesthesia Transfer of Care Note  Patient: RUMEAL CULLIPHER  Procedure(s) Performed: Lumbar four-five Posterior lumbar interbody fusion (N/A Spine Lumbar)  Patient Location: PACU  Anesthesia Type:General  Level of Consciousness: alert  and drowsy  Airway & Oxygen Therapy: Patient Spontanous Breathing and Patient connected to nasal cannula oxygen  Post-op Assessment: Report given to RN and Post -op Vital signs reviewed and stable  Post vital signs: Reviewed and stable  Last Vitals:  Vitals Value Taken Time  BP 163/109 05/01/21 1720  Temp    Pulse 68 05/01/21 1721  Resp    SpO2 100 % 05/01/21 1721  Vitals shown include unvalidated device data.  Last Pain:  Vitals:   05/01/21 1115  TempSrc:   PainSc: 4       Patients Stated Pain Goal: 2 (05/01/21 1115)  Complications: No complications documented.

## 2021-05-01 NOTE — Op Note (Signed)
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  Posterior lumbar interbody arthrodesis using peek spacers local autograft and allograft with pedicle screw fixation L4-L5.  Posterior lateral arthrodesis with local autograft allograft and Proteus. Surgeon: Barnett Abu First Assistant: Hoyt Koch, MD Anesthesia: General endotracheal Indications: Ryan Molina is a 75 year old individual whose had significant back and left leg pain that recurred approximately 3 months after he had a microdiscectomy at L4-5 on the left.  Follow-up films demonstrate that he has a large recurrent disc herniation at L4-5.  He has been advised regarding the need for surgical decompression of this area and that is now being performed.  Procedure: Patient was brought to the operating room supine on the stretcher.  After the smooth induction of general tracheal anesthesia, he was carefully turned prone.  The bony prominences were appropriately padded and protected.  His left arm was placed at his side because he has significant shoulder pathology.  After everything was padded and protected the back was prepped with alcohol DuraPrep and draped in a sterile fashion.  Midline incision was created in the lumbar region and carried down to the lumbodorsal fascia.  Fascia was opened on either side of midline to expose the spinous processes of L4 and L5 after clearing off the interlaminar spaces L4-5 could be identified positively yet this was verified with a radiograph.  Then by dissecting the fascia free on the left side where he had a previous laminotomy we further increase the size of the laminotomy by removing the inferior margin lamina of L4 out to including the entirety of the facet  at L4-5.  The yellow ligament was taken up and the common dural tube was then carefully decompressed.  Laterally there is noted to be a significant mass attached to the undersurface of the dura and by gently dissecting this area was able to mobilize the mass which was found to be a large fragment of disc material.  Several other fragments of disc material retrieved from this region some of them were adherent to the dura care was taken to dissect these free so as not to damage the dura and created inadvertent dural opening.  The disc space was then entered and a substantial quantity of severely degenerated desiccated disc material was encountered this space was opened further with a spreader and then on the contralateral side we did a laminotomy and foraminotomies to remove the entirety of the laminar arch L2 and including the facet at L4-L5.  The L ligament was taken up the common dural tube was decompressed and the path the L4 nerve root superiorly and the L5 nerve root inferiorly was identified.  Care was taken to retract gently in this region and the disc space was identified.  #15 blade was used to open up the disc space and removed substantial quantities of severely degenerated desiccated disc material in this region.  The endplates were then curettaged smoothed on both sides preparing the endplates for grafting.  The disc was removed down to the ventral aspect of the annular ligament.  Once all disc material was removed and the cortical surfaces were identified at L4 and L5 interbody's spacer trial was used to decide that a 10 mm tall 12 degree lordotic spacer measuring 23 mm in length would fit best into this interval.  To  the spacers were filled with autograft and allograft and Proteus.  A total of 15 cc of bone graft was packed into the interspace along with the 2 spacers.  Pedicle entry sites were then chosen fluoroscopically at L4 and L5 and 6.5 x 45 mm screws were used to connect the pedicles at L4 and  L5 with precontoured 45 mm rods.  These were tightened in a neutral construct.  Prior to this lateral gutters which had been previously decorticated and intertransverse space were packed with approximately 6 cc of bone graft in each lateral gutter.  Once all the bone graft was placed and the final tightening of the hardware was performed final radiographs in the AP and lateral projection were obtained.  25 cc of half percent Marcaine was injected into the paraspinous fascia.  After carefully inspecting the pads of the L4 and L5 nerve root and the common dural tube making sure that there was adequate hemostasis in the wound lumbodorsal fascia was closed with #1 Vicryl interrupted fashion 2-0 Vicryl was used in subcutaneous tissues 3-0 Vicryl subcuticularly and Dermabond on the skin.  Total blood loss for the procedure was estimated 200 cc.

## 2021-05-01 NOTE — H&P (Signed)
Ryan Molina is an 75 y.o. male.   Chief Complaint: Back and recurrent left leg pain HPI: Ryan Molina is a 75 year old individual who has had a herniated nucleus pulposus at L4-L5 in the past.  He suffered for a number of months with this creating severe left leg pain then in January he underwent a microdiscectomy.  He got good relief of his pain and this lasted for nearly a 64-month period of time then suddenly he recorded the pain in the past few weeks.  An MRI of the lumbar spine was completed recently and this demonstrates a large recurrent disc herniation at L4-L5 with substantial collapse of the disc space since his previous study.  It is apparent that he has a very minor listhesis also at the L4-L5 level.  Given the size of the disc herniation and the amount of disc degeneration at the L4-L5 level and the patient's pain pattern I have advised surgical decompression and stabilization of L4-L5.  He is now admitted for the surgery.  Past Medical History:  Diagnosis Date  . Arthritis    left  . Hypertension   . Vertigo     Past Surgical History:  Procedure Laterality Date  . BACK SURGERY     microdiscectomy 01/09/2021  . CHOLECYSTECTOMY    . DIAGNOSTIC LAPAROSCOPY    . JOINT REPLACEMENT    . TOTAL SHOULDER REPLACEMENT Left 2018    History reviewed. No pertinent family history. Social History:  reports that he has never smoked. He has never used smokeless tobacco. He reports that he does not drink alcohol and does not use drugs.  Allergies: No Known Allergies  Medications Prior to Admission  Medication Sig Dispense Refill  . acetaminophen (TYLENOL) 650 MG CR tablet Take 1,300 mg by mouth every 8 (eight) hours as needed for pain.    Marland Kitchen ascorbic acid (VITAMIN C) 1000 MG tablet Take 1,000 mg by mouth daily.    . Biotin 5000 MCG TABS Take 5,000 mcg by mouth daily.    . Calcium Carbonate-Vitamin D (CALTRATE 600+D PO) Take 1 tablet by mouth daily.    . Cholecalciferol (VITAMIN  D) 50 MCG (2000 UT) CAPS Take 2,000 Units by mouth daily.    Marland Kitchen gabapentin (NEURONTIN) 300 MG capsule Take 300 mg by mouth in the morning, at noon, and at bedtime.    . Glucosamine-Chondroitin (MOVE FREE PO) Take 2 tablets by mouth at bedtime.    Marland Kitchen lisinopril (ZESTRIL) 5 MG tablet Take 5 mg by mouth daily.    . meloxicam (MOBIC) 7.5 MG tablet Take 7.5 mg by mouth 2 (two) times daily.    . Multiple Vitamin (THERA) TABS Take 1 tablet by mouth daily.    . Omega-3 Fatty Acids (FISH OIL) 1000 MG CAPS Take 1,000 mg by mouth daily.    . psyllium (REGULOID) 0.52 g capsule Take 1.04 g by mouth daily.    . traMADol (ULTRAM) 50 MG tablet Take 50 mg by mouth every 6 (six) hours as needed for pain.      Results for orders placed or performed during the hospital encounter of 05/01/21 (from the past 48 hour(s))  ABO/Rh     Status: None   Collection Time: 05/01/21 11:20 AM  Result Value Ref Range   ABO/RH(D)      A POS Performed at [facility], [address] [place], [place] [address]   I-STAT, chem 8     Status: Abnormal   Collection Time:  05/01/21 11:23 AM  Result Value Ref Range   Sodium 130 (L) 135 - 145 mmol/L   Potassium 4.4 3.5 - 5.1 mmol/L   Chloride 96 (L) 98 - 111 mmol/L   BUN 14 8 - 23 mg/dL   Creatinine, Ser 2.99 0.61 - 1.24 mg/dL   Glucose, Bld 87 70 - 99 mg/dL    Comment: Glucose reference range applies only to samples taken after fasting for at least 8 hours.   Calcium, Ion 1.18 1.15 - 1.40 mmol/L   TCO2 26 22 - 32 mmol/L   Hemoglobin 16.0 13.0 - 17.0 g/dL   HCT 37.1 69.6 - 78.9 %   No results found.  Review of Systems  Constitutional: Negative.   HENT: Negative.   Eyes: Negative.   Respiratory: Negative.   Cardiovascular: Negative.   Gastrointestinal: Negative.   Endocrine: Negative.   Genitourinary: Negative.   Musculoskeletal: Positive for back pain, gait problem and myalgias.  Skin: Negative.   Allergic/Immunologic: Negative.   Neurological: Positive for  weakness and numbness.  Hematological: Negative.   Psychiatric/Behavioral: Negative.     Blood pressure (!) 181/98, pulse 67, temperature 97.8 F (36.6 C), temperature source Oral, resp. rate 17, height 5\' 9"  (1.753 m), weight 102.1 kg, SpO2 98 %. Physical Exam Constitutional:      Appearance: Normal appearance.  HENT:     Right Ear: Tympanic membrane normal.     Left Ear: Tympanic membrane normal.     Nose: Nose normal.     Mouth/Throat:     Mouth: Mucous membranes are moist.  Eyes:     Extraocular Movements: Extraocular movements intact.     Pupils: Pupils are equal, round, and reactive to light.  Cardiovascular:     Rate and Rhythm: Normal rate and regular rhythm.     Heart sounds: Normal heart sounds.  Pulmonary:     Effort: Pulmonary effort is normal.     Breath sounds: Normal breath sounds.  Abdominal:     General: Abdomen is flat.     Palpations: Abdomen is soft.  Musculoskeletal:        General: Normal range of motion.     Cervical back: Normal range of motion.  Skin:    General: Skin is warm and dry.     Capillary Refill: Capillary refill takes less than 2 seconds.  Neurological:     Mental Status: He is alert.  Psychiatric:        Mood and Affect: Mood normal.        Behavior: Behavior normal.      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  Plan: Posterior lumbar interbody arthrodesis with decompression of the L4 and L5 nerve roots.  Pedicle screw fixation L4-L5.  , MD 05/01/2021, 1:29 PM

## 2021-05-01 NOTE — Anesthesia Postprocedure Evaluation (Signed)
Anesthesia Post Note  Patient: Ryan Molina  Procedure(s) Performed: Lumbar four-five Posterior lumbar interbody fusion (N/A Spine Lumbar)     Patient location during evaluation: PACU Anesthesia Type: General Level of consciousness: awake and alert Pain management: pain level controlled Vital Signs Assessment: post-procedure vital signs reviewed and stable Respiratory status: spontaneous breathing, nonlabored ventilation, respiratory function stable and patient connected to nasal cannula oxygen Cardiovascular status: blood pressure returned to baseline and stable Postop Assessment: no apparent nausea or vomiting Anesthetic complications: no   No complications documented.  Last Vitals:  Vitals:   05/01/21 1842 05/01/21 1915  BP: (!) 141/97 (!) 122/93  Pulse: 69 69  Resp: 20 18  Temp: 36.4 C 36.5 C  SpO2: 97% 97%    Last Pain:  Vitals:   05/01/21 1915  TempSrc: Oral  PainSc:                  Beryle Lathe

## 2021-05-02 DIAGNOSIS — M5116 Intervertebral disc disorders with radiculopathy, lumbar region: Secondary | ICD-10-CM | POA: Diagnosis not present

## 2021-05-02 LAB — CBC
HCT: 40.5 % (ref 39.0–52.0)
Hemoglobin: 14.2 g/dL (ref 13.0–17.0)
MCH: 31.4 pg (ref 26.0–34.0)
MCHC: 35.1 g/dL (ref 30.0–36.0)
MCV: 89.6 fL (ref 80.0–100.0)
Platelets: 179 10*3/uL (ref 150–400)
RBC: 4.52 MIL/uL (ref 4.22–5.81)
RDW: 12.4 % (ref 11.5–15.5)
WBC: 15.2 10*3/uL — ABNORMAL HIGH (ref 4.0–10.5)
nRBC: 0 % (ref 0.0–0.2)

## 2021-05-02 LAB — BASIC METABOLIC PANEL
Anion gap: 7 (ref 5–15)
BUN: 13 mg/dL (ref 8–23)
CO2: 24 mmol/L (ref 22–32)
Calcium: 8.7 mg/dL — ABNORMAL LOW (ref 8.9–10.3)
Chloride: 95 mmol/L — ABNORMAL LOW (ref 98–111)
Creatinine, Ser: 1.09 mg/dL (ref 0.61–1.24)
GFR, Estimated: >60 mL/min (ref >60)
Glucose, Bld: 155 mg/dL — ABNORMAL HIGH (ref 70–99)
Potassium: 4.8 mmol/L (ref 3.5–5.1)
Sodium: 126 mmol/L — ABNORMAL LOW (ref 135–145)

## 2021-05-02 MED ORDER — TAMSULOSIN HCL 0.4 MG PO CAPS
0.4000 mg | ORAL_CAPSULE | Freq: Every day | ORAL | Status: DC
  Administered 2021-05-02: 0.4 mg via ORAL
  Filled 2021-05-02: qty 1

## 2021-05-02 MED ORDER — OXYCODONE-ACETAMINOPHEN 5-325 MG PO TABS: 1.0000 | ORAL_TABLET | ORAL | 0 refills | Status: AC | PRN

## 2021-05-02 MED ORDER — METHOCARBAMOL 500 MG PO TABS: 500.0000 mg | ORAL_TABLET | Freq: Four times a day (QID) | ORAL | 2 refills | Status: AC | PRN

## 2021-05-02 NOTE — Care Management CC44 (Signed)
Condition Code 44 Documentation Completed  Patient Details  Name: Ryan Molina MRN: [id number] Date of Birth: 1946-10-16   Condition Code 44 given:  Yes Patient signature on Condition Code 44 notice:  Yes Documentation of 2 MD's agreement:  Yes Code 44 added to claim:  Yes    Lorri Frederick, LCSW 05/02/2021, 10:04 AM

## 2021-05-02 NOTE — Care Management Obs Status (Signed)
MEDICARE OBSERVATION STATUS NOTIFICATION   Patient Details  Name: Ryan Molina MRN: [id number] Date of Birth: 09-Jul-1946   Medicare Observation Status Notification Given:  Yes    Lorri Frederick, LCSW 05/02/2021, 10:04 AM

## 2021-05-02 NOTE — Plan of Care (Signed)
Adequately ready for discharge 

## 2021-05-02 NOTE — Evaluation (Signed)
Physical Therapy Evaluation and Discharge Patient Details Name: Ryan Molina MRN: [id number] DOB: 1946/11/30 Today's Date: 05/02/2021   History of Present Illness  Pt is a 75 y/o male with PMH of arthritis, vertigo, HTN, prior back surgery, L shoulder replacement. S/P posterior lumbar interbody fusion L4-5.  Clinical Impression  Patient evaluated by Physical Therapy with no further acute PT needs identified. All education has been completed and the patient has no further questions. Pt was able to demonstrate transfers and ambulation with gross modified independence to supervision for safety and no AD. Pt was educated on precautions, brace application/wearing schedule, appropriate activity progression, and car transfer. Exercises provided for visual accommodation and segmental turning to help vertigo symptoms. Recommend follow-up with an outpatient vestibular physical therapist if vertigo symptoms persist. See below for any follow-up Physical Therapy or equipment needs. PT is signing off. Thank you for this referral.     Follow Up Recommendations Outpatient PT (Vestibular Rehab when appropriate per post-op protocol.)    Equipment Recommendations  None recommended by PT    Recommendations for Other Services       Precautions / Restrictions Precautions Precautions: Back Precaution Booklet Issued: Yes (comment) Precaution Comments: reviewed with pt Required Braces or Orthoses: Spinal Brace Spinal Brace: Lumbar corset;Applied in sitting position Restrictions Weight Bearing Restrictions: No      Mobility  Bed Mobility        General bed mobility comments: Pt was recevied sitting up in the recliner chair. Verbally reviewed log roll technique.    Transfers Overall transfer level: Modified independent Equipment used: None Transfers: Sit to/from Stand          General transfer comment: No assist required. Pt with mild unsteadiness but no  LOB.  Ambulation/Gait Ambulation/Gait assistance: Supervision Gait Distance (Feet): 350 Feet Assistive device: None Gait Pattern/deviations: Step-through pattern;Decreased stride length;Trunk flexed Gait velocity: Decreased Gait velocity interpretation: <1.31 ft/sec, indicative of household ambulator General Gait Details: VC's for improved posture. Overall steady but with decreased gait speed.  Stairs Stairs:  (Declined)          Wheelchair Mobility    Modified Rankin (Stroke Patients Only)       Balance Overall balance assessment: Mild deficits observed, not formally tested                                           Pertinent Vitals/Pain Pain Assessment: Faces Faces Pain Scale: Hurts little more Pain Location: back- incisional Pain Descriptors / Indicators: Discomfort;Operative site guarding Pain Intervention(s): Limited activity within patient's tolerance;Monitored during session;Repositioned;Patient requesting pain meds-RN notified    Home Living Family/patient expects to be discharged to:: Private residence Living Arrangements: Spouse/significant other Available Help at Discharge: Family;Available 24 hours/day Type of Home: House Home Access: Stairs to enter   [employer] of Steps: 1 Home Layout: One level Home Equipment: Shower seat;Toilet riser;Grab bars - tub/shower ("up" walker)      Prior Function Level of Independence: Independent         Comments: using "up walker" as needed, otherwise independent and driving     Hand Dominance        Extremity/Trunk Assessment   Upper Extremity Assessment Upper Extremity Assessment: Overall WFL for tasks assessed    Lower Extremity Assessment Lower Extremity Assessment: Defer to PT evaluation    Cervical / Trunk Assessment Cervical / Trunk Assessment: Other exceptions Cervical /  Trunk Exceptions: s/p back surgery  Communication   Communication: No difficulties   Cognition Arousal/Alertness: Awake/alert Behavior During Therapy: WFL for tasks assessed/performed Overall Cognitive Status: Within Functional Limits for tasks assessed                                        General Comments General comments (skin integrity, edema, etc.): spouse present and supportive    Exercises Other Exercises Other Exercises: x1 exercises for suspected BPPV. Pt on Meclizine but not treated at the MD office.   Assessment/Plan    PT Assessment Patient needs continued PT services  PT Problem List Decreased strength;Decreased activity tolerance;Decreased balance;Decreased mobility;Decreased knowledge of use of DME;Decreased safety awareness;Decreased knowledge of precautions;Pain       PT Treatment Interventions DME instruction;Gait training;Functional mobility training;Therapeutic activities;Therapeutic exercise;Neuromuscular re-education;Patient/family education    PT Goals (Current goals can be found in the Care Plan section)  Acute Rehab PT Goals Patient Stated Goal: home, less pain PT Goal Formulation: All assessment and education complete, DC therapy    Frequency Min 5X/week   Barriers to discharge        Co-evaluation               AM-PAC PT "6 Clicks" Mobility  Outcome Measure Help needed turning from your back to your side while in a flat bed without using bedrails?: None Help needed moving from lying on your back to sitting on the side of a flat bed without using bedrails?: None Help needed moving to and from a bed to a chair (including a wheelchair)?: None Help needed standing up from a chair using your arms (e.g., wheelchair or bedside chair)?: None Help needed to walk in hospital room?: A Little Help needed climbing 3-5 steps with a railing? : A Little 6 Click Score: 22    End of Session Equipment Utilized During Treatment: Gait belt;Back brace Activity Tolerance: Patient tolerated treatment well Patient left: in  chair;with call bell/phone within reach;with family/visitor present   PT Visit Diagnosis: Unsteadiness on feet (R26.81);Pain Pain - part of body:  (back)    Time: 0272-5366 PT Time Calculation (min) (ACUTE ONLY): 20 min   Charges:   PT Evaluation $PT Eval Low Complexity: 1 Low          Conni Slipper, PT, DPT Acute Rehabilitation Services Pager: [phone] Office: [phone]   Marylynn Pearson 05/02/2021, 12:09 PM

## 2021-05-02 NOTE — Discharge Instructions (Signed)
Wound Care Leave incision open to air. You may shower. Do not scrub directly on incision.  Do not put any creams, lotions, or ointments on incision. Activity Walk each and every day, increasing distance each day. No lifting greater than 5 lbs.  Avoid bending, arching, and twisting. No driving for 2 weeks; may ride as a passenger locally. If provided with back brace, wear when out of bed.  It is not necessary to wear in bed. Diet Resume your normal diet.  Return to Work Will be discussed at you follow up appointment. Call Your Doctor If Any of These Occur Redness, drainage, or swelling at the wound.  Temperature greater than 101 degrees. Severe pain not relieved by pain medication. Incision starts to come apart. Follow Up Appt Call today for appointment in 2 weeks ([phone]) or for problems.  If you have any hardware placed in your spine, you will need an x-ray before your appointment. 

## 2021-05-02 NOTE — Evaluation (Signed)
Occupational Therapy Evaluation Patient Details Name: Ryan Molina MRN: [id number] DOB: 06-01-1946 Today's Date: 05/02/2021    History of Present Illness Pt is a 75 y/o male with PMH of arthritis, vertigo, HTN, prior back surgery, L shoulder replacement. S/P posterior lumbar interbody fusion L4-5.   Clinical Impression   PTA patient independent and driving, reports using "up walker" as needed for pain.  Admitted for above and limited by problem list below, including back precautions and pain.  Patient educated on precautions, ADL compensatory techniques, DME, safety, recommendations, and brace mgmt/wear schedule.  Patient currently requires supervision for ADLs, functional mobility and transfers without AD, min cueing to avoid twisting during functional tasks.  He has good support of his spouse who can provide assist as needed.  Based on performance today, no further OT needs have been identified and OT will sign off. Thank you for this referral.     Follow Up Recommendations  No OT follow up;Supervision - Intermittent    Equipment Recommendations  None recommended by OT    Recommendations for Other Services       Precautions / Restrictions Precautions Precautions: Back Precaution Booklet Issued: Yes (comment) Precaution Comments: reviewed with pt Required Braces or Orthoses: Spinal Brace Spinal Brace: Lumbar corset;Applied in sitting position Restrictions Weight Bearing Restrictions: No      Mobility Bed Mobility Overal bed mobility: Needs Assistance Bed Mobility: Rolling;Sidelying to Sit Rolling: Supervision Sidelying to sit: Supervision       General bed mobility comments: HOB flat with no rails, supervision for safety    Transfers Overall transfer level: Needs assistance   Transfers: Sit to/from Stand Sit to Stand: Supervision         General transfer comment: for posture and technique, no physical assist    Balance Overall balance assessment: Mild  deficits observed, not formally tested                                         ADL either performed or assessed with clinical judgement   ADL Overall ADL's : Needs assistance/impaired     Grooming: Supervision/safety;Standing   Upper Body Bathing: Set up;Sitting   Lower Body Bathing: Supervison/ safety;Sit to/from stand Lower Body Bathing Details (indicate cue type and reason): figure 4 technique to reach feet, educated on use of long sponge and completing seated Upper Body Dressing : Sitting;Minimal assistance Upper Body Dressing Details (indicate cue type and reason): for brace mgmt Lower Body Dressing: Supervision/safety;Sit to/from stand Lower Body Dressing Details (indicate cue type and reason): able to manage clothing using figure 4 technique as needed Toilet Transfer: Supervision/safety;Ambulation           Functional mobility during ADLs: Supervision/safety General ADL Comments: pt educated on compensatory techniques for ADLs due to back precautions, good recall of precautions with min cueing to avoid twisting functionally.  Reviewed shower transfers and toileting verbally.     Vision         Perception     Praxis      Pertinent Vitals/Pain Pain Assessment: Faces Faces Pain Scale: Hurts little more Pain Location: back- incisional Pain Descriptors / Indicators: Discomfort;Operative site guarding Pain Intervention(s): Limited activity within patient's tolerance;Monitored during session;Repositioned;Patient requesting pain meds-RN notified     Hand Dominance     Extremity/Trunk Assessment Upper Extremity Assessment Upper Extremity Assessment: Overall WFL for tasks assessed   Lower Extremity Assessment Lower  Extremity Assessment: Defer to PT evaluation   Cervical / Trunk Assessment Cervical / Trunk Assessment: Other exceptions Cervical / Trunk Exceptions: s/p back surgery   Communication Communication Communication: No difficulties    Cognition Arousal/Alertness: Awake/alert Behavior During Therapy: WFL for tasks assessed/performed Overall Cognitive Status: Within Functional Limits for tasks assessed                                     General Comments  spouse present and supportive    Exercises     Shoulder Instructions      Home Living Family/patient expects to be discharged to:: Private residence Living Arrangements: Spouse/significant other Available Help at Discharge: Family;Available 24 hours/day Type of Home: House Home Access: Stairs to enter [employer] of Steps: 1   Home Layout: One level     Bathroom Shower/Tub: Producer, television/film/video: Standard     Home Equipment: Shower seat;Toilet riser;Grab bars - tub/shower ("up" walker)          Prior Functioning/Environment Level of Independence: Independent        Comments: using "up walker" as needed, otherwise independent and driving        OT Problem List: Decreased activity tolerance;Decreased safety awareness;Decreased knowledge of use of DME or AE;Decreased knowledge of precautions;Pain      OT Treatment/Interventions:      OT Goals(Current goals can be found in the care plan section) Acute Rehab OT Goals Patient Stated Goal: home, less pain OT Goal Formulation: With patient Time For Goal Achievement: 05/16/21 Potential to Achieve Goals: Good  OT Frequency:     Barriers to D/C:            Co-evaluation              AM-PAC OT "6 Clicks" Daily Activity     Outcome Measure Help from another person eating meals?: None Help from another person taking care of personal grooming?: A Little Help from another person toileting, which includes using toliet, bedpan, or urinal?: A Little Help from another person bathing (including washing, rinsing, drying)?: A Little Help from another person to put on and taking off regular upper body clothing?: A Little Help from another person to put on and  taking off regular lower body clothing?: A Little 6 Click Score: 19   End of Session Equipment Utilized During Treatment: Back brace Nurse Communication: Mobility status  Activity Tolerance: Patient tolerated treatment well Patient left: in chair;with call bell/phone within reach;with family/visitor present  OT Visit Diagnosis: Other abnormalities of gait and mobility (R26.89);Pain Pain - part of body:  (back)                Time: 7893-8101 OT Time Calculation (min): 35 min Charges:  OT General Charges $OT Visit: 1 Visit OT Evaluation $OT Eval Low Complexity: 1 Low OT Treatments $Self Care/Home Management : 8-22 mins  Barry Brunner, OT Acute Rehabilitation Services Pager [phone] Office [phone]   Chancy Milroy 05/02/2021, 10:24 AM

## 2021-05-02 NOTE — Discharge Summary (Signed)
Physician Discharge Summary  Patient ID: Ryan Molina MRN: [id number] DOB/AGE: [birth year] 75 y.o.  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  Consults: None  Significant Diagnostic Studies: labs: None  Treatments: surgery: See op note  Discharge Exam: Blood pressure 124/77, pulse 74, temperature 97.8 F (36.6 C), temperature source Oral, resp. rate 18, height 5\' 9"  (1.753 m), weight 102.1 kg, SpO2 95 %. Incision is clean and dry motor function is intact Station and gait are intact  Disposition: Discharge disposition: 01-Home or Self Care       Discharge Instructions    Call MD for:  redness, tenderness, or signs of infection (pain, swelling, redness, odor or green/yellow discharge around incision site)   Complete by: As directed    Call MD for:  severe uncontrolled pain   Complete by: As directed    Call MD for:  temperature >100.4   Complete by: As directed    Diet - low sodium heart healthy   Complete by: As directed    Discharge wound care:   Complete by: As directed    Okay to shower. Do not apply salves or appointments to incision. No heavy lifting with the upper extremities greater than 10 pounds. May resume driving when not requiring pain medication and patient feels comfortable with doing so.   Incentive spirometry RT   Complete by: As directed    Increase activity slowly   Complete by: As directed      Allergies as of 05/02/2021   No Known Allergies     Medication List    TAKE these  medications   acetaminophen 650 MG CR tablet Commonly known as: TYLENOL Take 1,300 mg by mouth every 8 (eight) hours as needed for pain.   ascorbic acid 1000 MG tablet Commonly known as: VITAMIN C Take 1,000 mg by mouth daily.   Biotin 5000 MCG Tabs Take 5,000 mcg by mouth daily.   CALTRATE 600+D PO Take 1 tablet by mouth daily.   Fish Oil 1000 MG Caps Take 1,000 mg by mouth daily.   gabapentin 300 MG capsule Commonly known as: NEURONTIN Take 300 mg by mouth in the morning, at noon, and at bedtime.   lisinopril 5 MG tablet Commonly known as: ZESTRIL Take 5 mg by mouth daily.   meloxicam 7.5 MG tablet Commonly known as: MOBIC Take 7.5 mg by mouth 2 (two) times daily.   methocarbamol 500 MG tablet Commonly known as: ROBAXIN Take 1 tablet (500 mg total) by mouth every 6 (six) hours as needed for muscle spasms.   MOVE FREE PO Take 2 tablets by mouth at bedtime.   oxyCODONE-acetaminophen 5-325 MG tablet Commonly known as: PERCOCET/ROXICET Take 1-2 tablets by mouth every 4 (four) hours as needed for moderate pain or severe pain.   psyllium 0.52 g capsule Commonly known as: REGULOID Take 1.04 g by mouth daily.   Thera Tabs Take 1 tablet by mouth daily.   traMADol 50 MG tablet Commonly known as: ULTRAM Take 50 mg by mouth every 6 (six) hours as needed for pain.   Vitamin D 50 MCG (2000 UT)  Caps Take 2,000 Units by mouth daily.            Discharge Care Instructions  (From admission, onward)         Start     Ordered   05/02/21 0000  Discharge wound care:       Comments: Okay to shower. Do not apply salves or appointments to incision. No heavy lifting with the upper extremities greater than 10 pounds. May resume driving when not requiring pain medication and patient feels comfortable with doing so.   05/02/21 0932           Signed: Shary Key Rashida Ladouceur 05/02/2021, 9:33 AM

## 2021-05-02 NOTE — Progress Notes (Signed)
Patient alert and oriented, voiding adequately, skin clean, dry and intact without evidence of skin break down, or symptoms of complications - no redness or edema noted, only slight tenderness at site.  Patient states pain is manageable at time of discharge. Patient has an appointment with MD in 3 weeks. Patient and spouse stated understanding of instructions given.

## 2021-05-07 MED FILL — Sodium Chloride IV Soln 0.9%: INTRAVENOUS | Qty: 1000 | Status: AC

## 2021-05-07 MED FILL — Heparin Sodium (Porcine) Inj 1000 Unit/ML: INTRAMUSCULAR | Qty: 30 | Status: AC

## 2021-12-16 IMAGING — MR MR LUMBAR SPINE WO/W CM
4 of 7 series · 26 of 48 positions shown · IV contrast (gadavist)
Comparison: Outside lumbar MRI 07/19/2020. Intraoperative images

CLINICAL DATA: 74-year-old male with low back pain radiating to the
left hip and leg for 9 months. Recent L4-L5 disc surgery.

EXAM:
MRI LUMBAR SPINE WITHOUT AND WITH CONTRAST
TECHNIQUE: Multiplanar and multiecho pulse sequences of the lumbar spine were
obtained without and with intravenous contrast.
CONTRAST:  9.5mL GADAVIST GADOBUTROL 1 MMOL/ML IV SOLN

[Series 2: T2 · sagittal · 4.0mm · 0.81mm/px · 3 of 15 slices shown (1 of 2)]
[im 1/15]
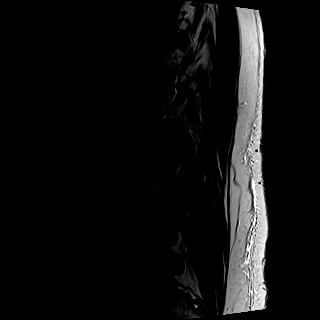
[im 8/15]
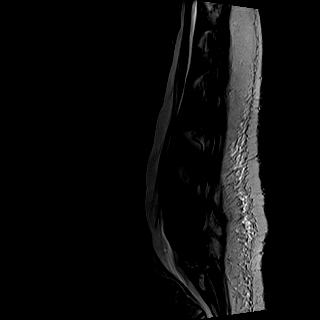
[im 15/15]
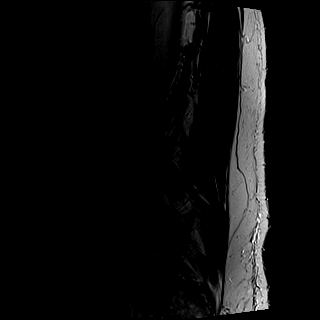

[Series 3: T1 · sagittal · 4.0mm · 0.41mm/px · 4 of 15 slices shown (1 of 2)]
[im 1/15]
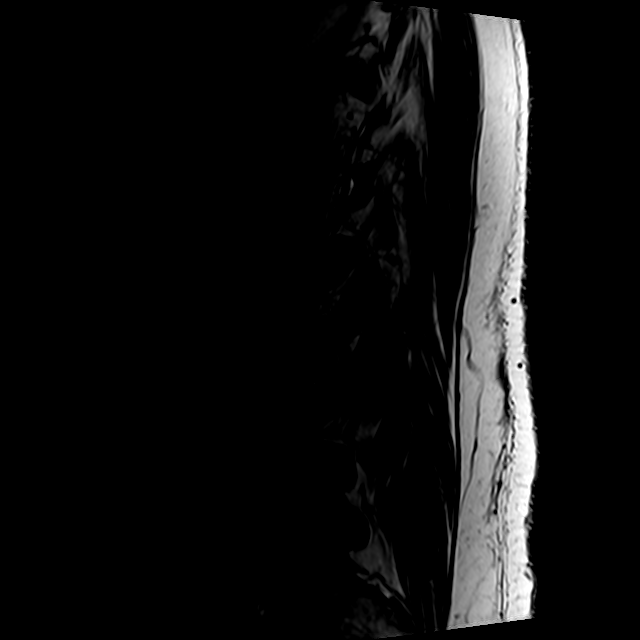
[im 5/15]
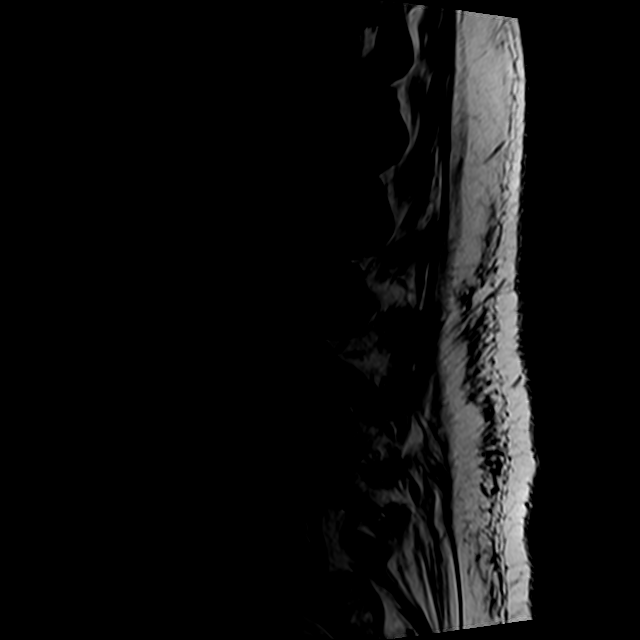
[im 10/15]
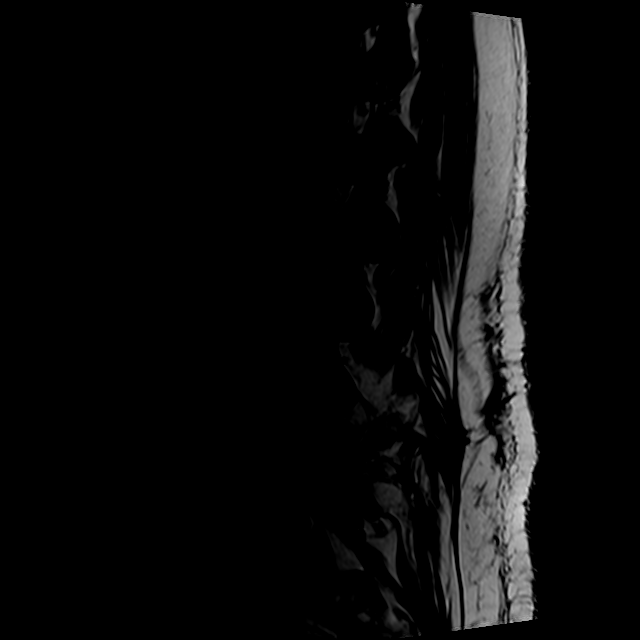
[im 15/15]
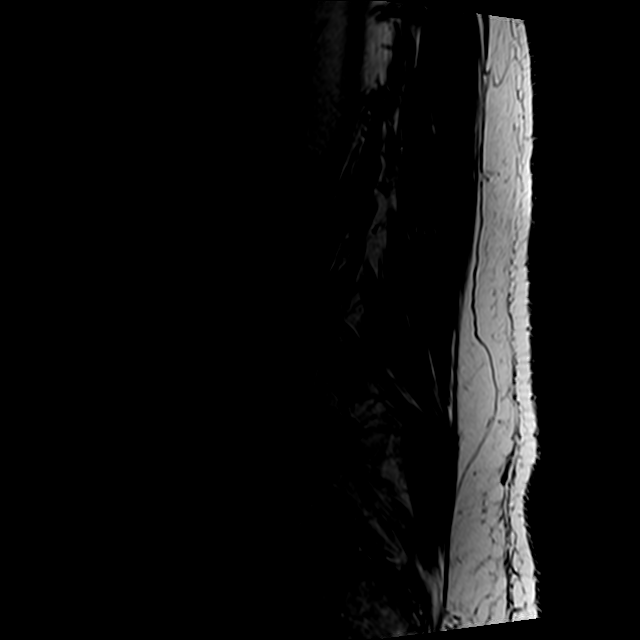

[Series 5: T2 · axial · 4.0mm · 0.78mm/px · z∈[-133,+81]mm · 11 of 41 slices shown (2 of 2)]
[im 1/41]
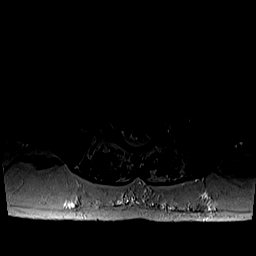
[im 5/41]
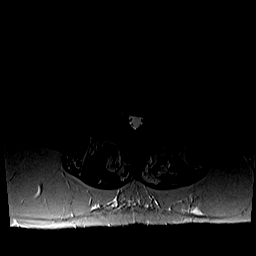
[im 9/41]
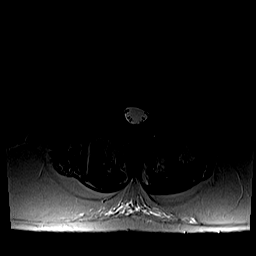
[im 13/41]
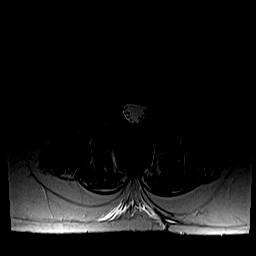
[im 17/41]
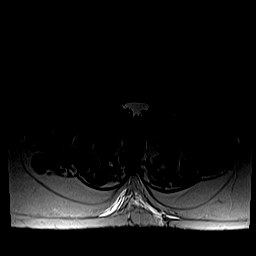
[im 21/41]
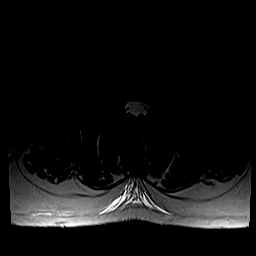
[im 25/41]
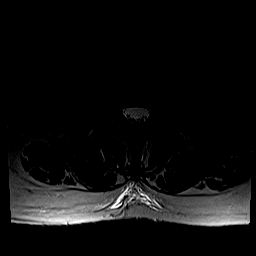
[im 29/41]
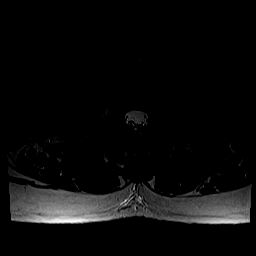
[im 33/41]
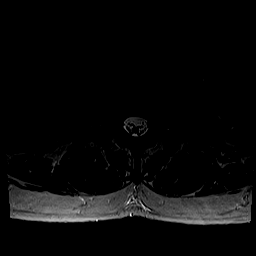
[im 37/41]
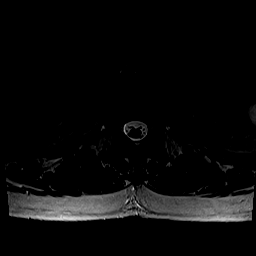
[im 41/41]
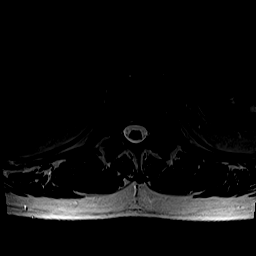

[Series 6: T1 · axial · 4.0mm · 0.39mm/px · z∈[-133,+61]mm · 8 of 41 slices shown (2 of 2)]
[im 1/41]
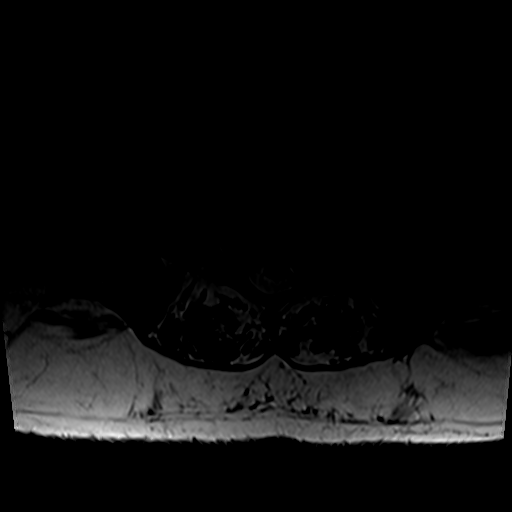
[im 5/41]
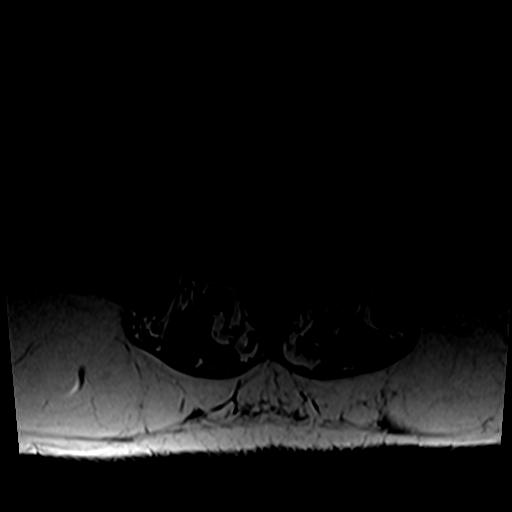
[im 9/41]
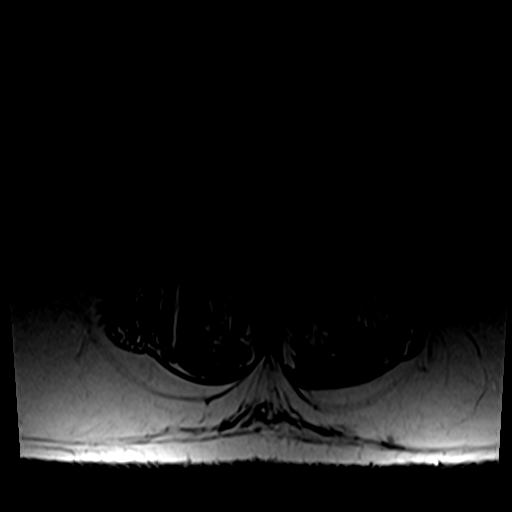
[im 13/41]
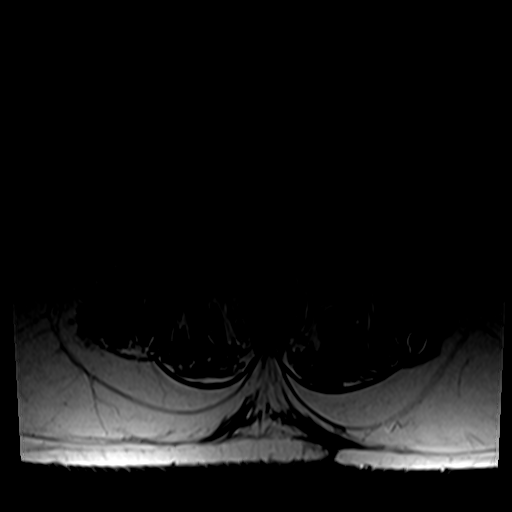
[im 17/41]
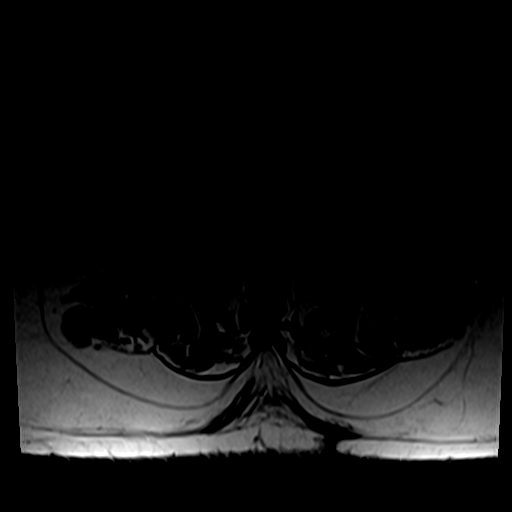
[im 21/41]
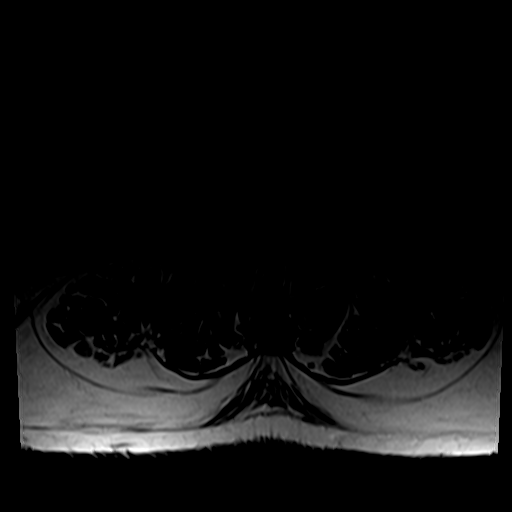
[im 25/41]
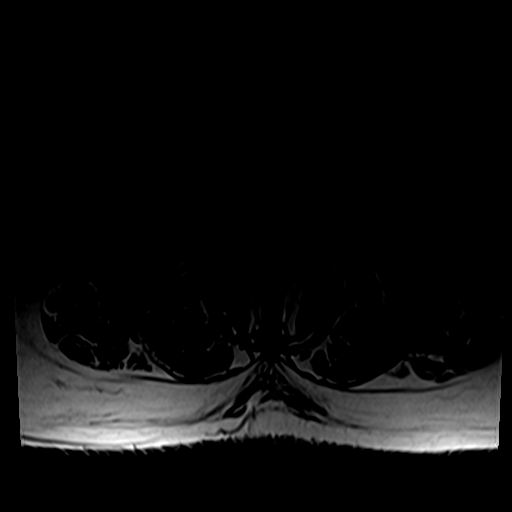
[im 37/41]
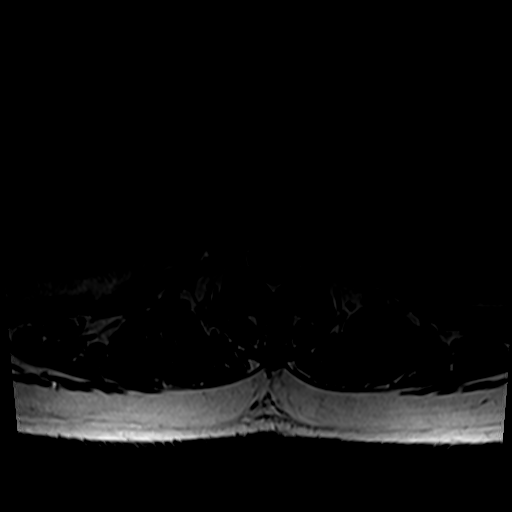

[26 of 48 positions shown; findings below may reference images not displayed]

FINDINGS: Segmentation: Lumbar segmentation appears to be normal and will be
designated as such for this report.

Alignment: Stable lumbar lordosis. Subtle retrolisthesis of L4 on
L5.

Vertebrae: No marrow edema or evidence of acute osseous abnormality.
Visualized bone marrow signal is within normal limits. Intact
visible sacrum and SI joints.

Conus medullaris and cauda equina: Conus extends to the L1 level. No
lower spinal cord or conus signal abnormality. No abnormal
intradural enhancement. No dural thickening.

Paraspinal and other soft tissues: Mild postoperative changes to the
left paraspinal soft tissues at L4 and L5, associated mild changes
in the erector spinae muscle tracking toward the sacrum on that
side.

Otherwise negative.

Disc levels:

Visible lower thoracic levels through L3-L4 are stable since last
year and unremarkable for age.

L4-L5: Mild retrolisthesis with disc space loss and circumferential
disc bulge. Interval left laminectomy. A larger broad-based disc
extrusion into the left lateral recess is demonstrated when compared
to last year (series 2, image 9 and series 5, image 32 with
heterogeneous rim enhancement following contrast. Superimposed
postoperative enhancing granulation tissue tracking to the left
lateral recess. Severe stenosis at the descending left L5 nerve
level. No significant spinal stenosis. Mild left L4 foraminal
stenosis is stable related to disc bulge and endplate spurring.

L5-S1: Stable left eccentric disc bulging. Mild epidural
lipomatosis. Superimposed mild endplate spurring and facet
hypertrophy. Moderate bilateral L5 foraminal stenosis is stable.
IMPRESSION: 1. Interval left laminectomy at L4-L5. Broad-based disc extrusion
into the left lateral recess at L4-L5, increased compared to the MRI
last year. Severe stenosis. Query Left L5 radiculitis.
2. Stable multifactorial mild left L4 and moderate bilateral L5
neural foraminal stenosis.
3. Other lumbar and lower thoracic levels are stable and
unremarkable for age.

## 2022-01-14 IMAGING — RF DG C-ARM 1-60 MIN
1 series · 2 of 2 positions shown · non-contrast
Comparison: Lumbar spine MRI 04/02/2021.

CLINICAL DATA: Surgery, elective. Additional history provided: L4-5
PLIF. Provided fluoroscopy time: 30 seconds (25.31 mGy).

EXAM:
LUMBAR SPINE - 2-3 VIEW; DG C-ARM 1-60 MIN

[Series 1: run · 2 of 2 slices shown]
[im 1/2]
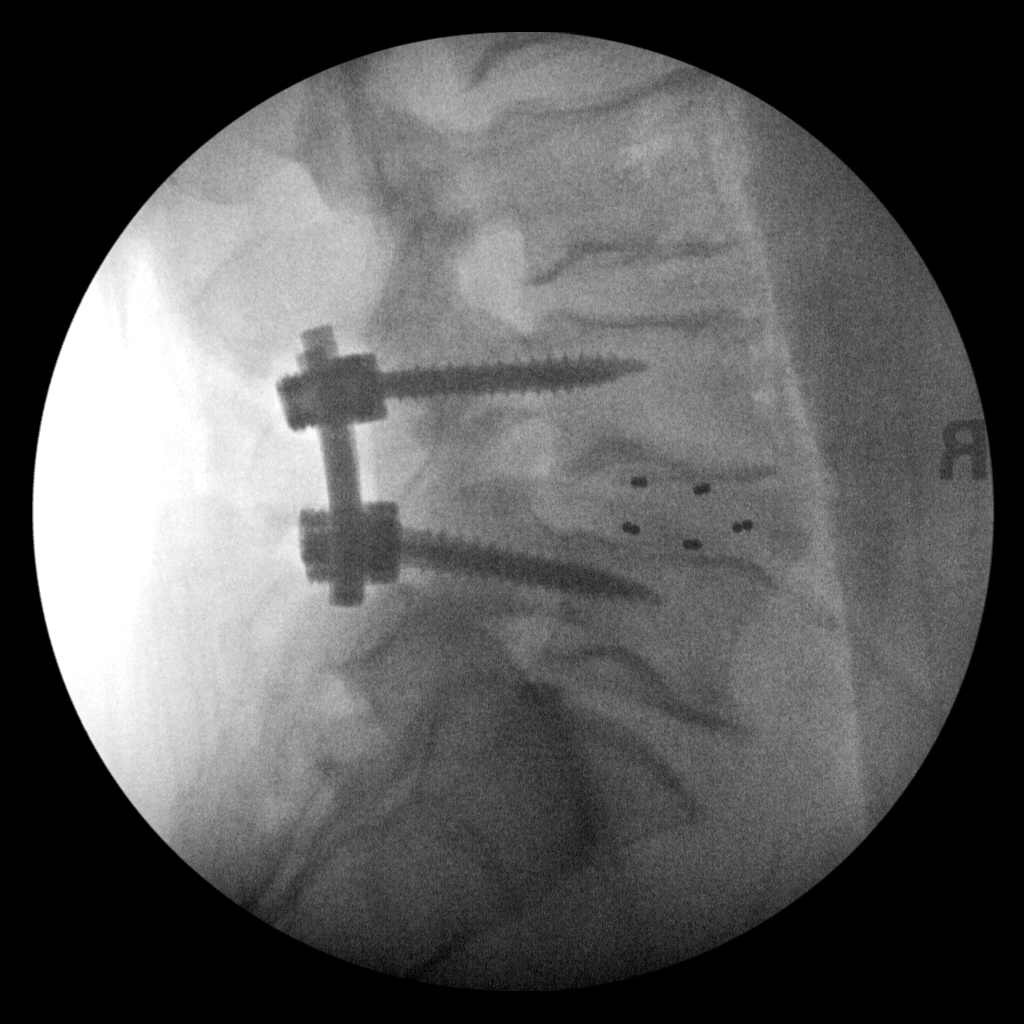
[im 2/2]
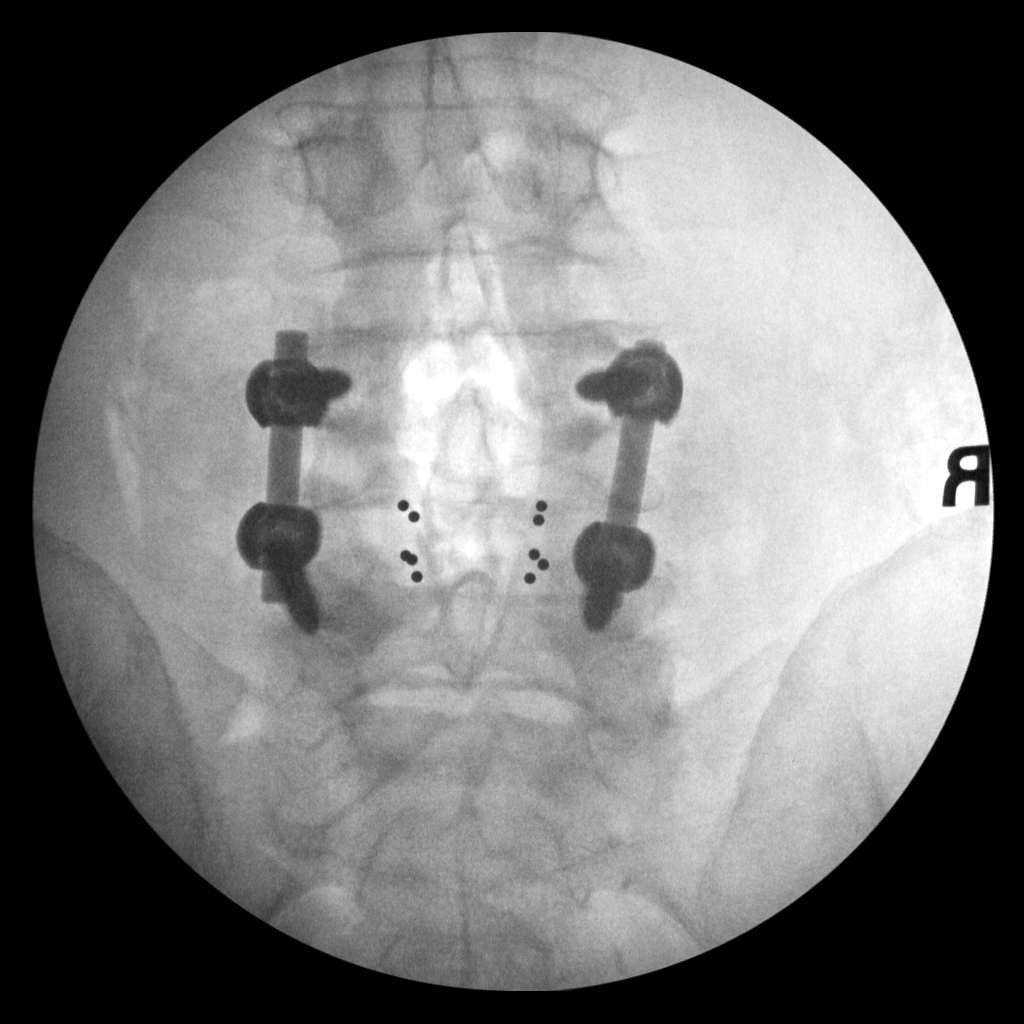

[2 of 2 positions shown; findings below may reference images not displayed]

FINDINGS: AP and lateral view intraoperative fluoroscopic images of the lumbar
spine are submitted, 2 images total. The lowest well-formed
intervertebral disc space is designated L5-S1. On the provided
images, a posterior spinal fusion construct is present at L4-L5
(bilateral pedicle screws and vertical interconnecting rods).
Interbody device(s) also now present at L4-L5.
IMPRESSION: Two intraoperative fluoroscopic images of the lumbar spine from
L4-L5 fusion, as described.

## 2024-01-21 ENCOUNTER — Ambulatory Visit (INDEPENDENT_AMBULATORY_CARE_PROVIDER_SITE_OTHER): Payer: Medicare Other | Admitting: Podiatry

## 2024-01-21 ENCOUNTER — Encounter: Payer: Self-pay | Admitting: Podiatry

## 2024-01-21 DIAGNOSIS — L03039 Cellulitis of unspecified toe: Secondary | ICD-10-CM | POA: Diagnosis not present

## 2024-01-21 DIAGNOSIS — L600 Ingrowing nail: Secondary | ICD-10-CM

## 2024-01-21 NOTE — Patient Instructions (Signed)

## 2024-01-22 NOTE — Progress Notes (Signed)
Subjective:   Patient ID: Ryan Molina, male   DOB: 78 y.o.   MRN: [id number]   HPI Patient presents with caregiver with very painful ingrown toenail deformity left big toe that has been going on for a while and he tried to trim it and he nicked the skin.  States it is very tender over the last few weeks and patient does not smoke likes to be active   Review of Systems  All other systems reviewed and are negative.       Objective:  Physical Exam Vitals and nursing note reviewed.  Constitutional:      Appearance: He is well-developed.  Pulmonary:     Effort: Pulmonary effort is normal.  Musculoskeletal:        General: Normal range of motion.  Skin:    General: Skin is warm.  Neurological:     Mental Status: He is alert.     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  Today I infiltrated the left big toe 60 mg Xylocaine Marcaine mixture and I then went ahead did sterile prep and with sterile instrumentation remove the medial border exposed matrix applied phenol 3 applications 30 seconds followed by alcohol lavage sterile dressing gave instructions on soaks and to wear dressing 24 hours and take off earlier if needed and encouraged to call with any questions
